# Patient Record
Sex: Male | Born: 1955 | Race: White | Hispanic: No | Marital: Single
Health system: Southern US, Community
[De-identification: ages and names within clinical notes are randomized; demographics above are authoritative.]

## PROBLEM LIST (undated history)

## (undated) HISTORY — PX: HIP SURGERY: SHX245

## (undated) HISTORY — PX: GALLBLADDER SURGERY: SHX652

---

## 2016-09-10 DIAGNOSIS — R0602 Shortness of breath: Secondary | ICD-10-CM | POA: Diagnosis not present

## 2016-09-10 DIAGNOSIS — K4041 Unilateral inguinal hernia, with gangrene, recurrent: Secondary | ICD-10-CM | POA: Diagnosis not present

## 2016-09-10 DIAGNOSIS — K4090 Unilateral inguinal hernia, without obstruction or gangrene, not specified as recurrent: Secondary | ICD-10-CM | POA: Diagnosis not present

## 2016-09-10 DIAGNOSIS — J439 Emphysema, unspecified: Secondary | ICD-10-CM | POA: Diagnosis not present

## 2016-09-10 DIAGNOSIS — R109 Unspecified abdominal pain: Secondary | ICD-10-CM | POA: Diagnosis not present

## 2016-09-10 DIAGNOSIS — J189 Pneumonia, unspecified organism: Secondary | ICD-10-CM | POA: Diagnosis not present

## 2016-09-10 DIAGNOSIS — K4091 Unilateral inguinal hernia, without obstruction or gangrene, recurrent: Secondary | ICD-10-CM | POA: Diagnosis not present

## 2016-09-10 DIAGNOSIS — N50811 Right testicular pain: Secondary | ICD-10-CM | POA: Diagnosis not present

## 2016-09-10 DIAGNOSIS — R918 Other nonspecific abnormal finding of lung field: Secondary | ICD-10-CM | POA: Diagnosis not present

## 2016-09-13 DIAGNOSIS — J189 Pneumonia, unspecified organism: Secondary | ICD-10-CM | POA: Diagnosis not present

## 2016-09-13 DIAGNOSIS — J181 Lobar pneumonia, unspecified organism: Secondary | ICD-10-CM | POA: Diagnosis not present

## 2016-09-13 DIAGNOSIS — R161 Splenomegaly, not elsewhere classified: Secondary | ICD-10-CM | POA: Diagnosis not present

## 2016-09-13 DIAGNOSIS — K4090 Unilateral inguinal hernia, without obstruction or gangrene, not specified as recurrent: Secondary | ICD-10-CM | POA: Diagnosis not present

## 2016-09-13 DIAGNOSIS — R0602 Shortness of breath: Secondary | ICD-10-CM | POA: Diagnosis not present

## 2016-09-13 DIAGNOSIS — R918 Other nonspecific abnormal finding of lung field: Secondary | ICD-10-CM | POA: Diagnosis not present

## 2016-09-13 DIAGNOSIS — R109 Unspecified abdominal pain: Secondary | ICD-10-CM | POA: Diagnosis not present

## 2016-09-13 DIAGNOSIS — N50819 Testicular pain, unspecified: Secondary | ICD-10-CM | POA: Diagnosis not present

## 2016-09-13 DIAGNOSIS — M539 Dorsopathy, unspecified: Secondary | ICD-10-CM | POA: Diagnosis not present

## 2016-09-13 DIAGNOSIS — N5089 Other specified disorders of the male genital organs: Secondary | ICD-10-CM | POA: Diagnosis not present

## 2016-11-08 DIAGNOSIS — Z1329 Encounter for screening for other suspected endocrine disorder: Secondary | ICD-10-CM | POA: Diagnosis not present

## 2016-11-08 DIAGNOSIS — F99 Mental disorder, not otherwise specified: Secondary | ICD-10-CM | POA: Diagnosis not present

## 2016-11-08 DIAGNOSIS — Z13220 Encounter for screening for lipoid disorders: Secondary | ICD-10-CM | POA: Diagnosis not present

## 2016-11-08 DIAGNOSIS — K4090 Unilateral inguinal hernia, without obstruction or gangrene, not specified as recurrent: Secondary | ICD-10-CM | POA: Diagnosis not present

## 2016-11-08 DIAGNOSIS — F338 Other recurrent depressive disorders: Secondary | ICD-10-CM | POA: Diagnosis not present

## 2016-11-08 DIAGNOSIS — R5383 Other fatigue: Secondary | ICD-10-CM | POA: Diagnosis not present

## 2016-11-08 DIAGNOSIS — Z79899 Other long term (current) drug therapy: Secondary | ICD-10-CM | POA: Diagnosis not present

## 2016-12-16 DIAGNOSIS — Z23 Encounter for immunization: Secondary | ICD-10-CM | POA: Diagnosis not present

## 2016-12-19 DIAGNOSIS — K4090 Unilateral inguinal hernia, without obstruction or gangrene, not specified as recurrent: Secondary | ICD-10-CM | POA: Diagnosis not present

## 2016-12-27 DIAGNOSIS — R338 Other retention of urine: Secondary | ICD-10-CM | POA: Diagnosis not present

## 2016-12-27 DIAGNOSIS — Z79899 Other long term (current) drug therapy: Secondary | ICD-10-CM | POA: Diagnosis not present

## 2016-12-27 DIAGNOSIS — F329 Major depressive disorder, single episode, unspecified: Secondary | ICD-10-CM | POA: Diagnosis not present

## 2016-12-27 DIAGNOSIS — K4030 Unilateral inguinal hernia, with obstruction, without gangrene, not specified as recurrent: Secondary | ICD-10-CM | POA: Diagnosis not present

## 2016-12-27 DIAGNOSIS — Y838 Other surgical procedures as the cause of abnormal reaction of the patient, or of later complication, without mention of misadventure at the time of the procedure: Secondary | ICD-10-CM | POA: Diagnosis not present

## 2016-12-27 DIAGNOSIS — K4090 Unilateral inguinal hernia, without obstruction or gangrene, not specified as recurrent: Secondary | ICD-10-CM | POA: Diagnosis not present

## 2016-12-27 DIAGNOSIS — F17210 Nicotine dependence, cigarettes, uncomplicated: Secondary | ICD-10-CM | POA: Diagnosis not present

## 2017-01-03 DIAGNOSIS — R338 Other retention of urine: Secondary | ICD-10-CM | POA: Diagnosis not present

## 2017-01-03 DIAGNOSIS — N9989 Other postprocedural complications and disorders of genitourinary system: Secondary | ICD-10-CM | POA: Diagnosis not present

## 2017-01-17 DIAGNOSIS — Z23 Encounter for immunization: Secondary | ICD-10-CM | POA: Diagnosis not present

## 2017-02-10 DIAGNOSIS — Z125 Encounter for screening for malignant neoplasm of prostate: Secondary | ICD-10-CM | POA: Diagnosis not present

## 2017-02-10 DIAGNOSIS — F338 Other recurrent depressive disorders: Secondary | ICD-10-CM | POA: Diagnosis not present

## 2017-02-10 DIAGNOSIS — Z0000 Encounter for general adult medical examination without abnormal findings: Secondary | ICD-10-CM | POA: Diagnosis not present

## 2017-02-10 DIAGNOSIS — Z131 Encounter for screening for diabetes mellitus: Secondary | ICD-10-CM | POA: Diagnosis not present

## 2017-02-10 DIAGNOSIS — M25551 Pain in right hip: Secondary | ICD-10-CM | POA: Diagnosis not present

## 2017-02-10 DIAGNOSIS — F99 Mental disorder, not otherwise specified: Secondary | ICD-10-CM | POA: Diagnosis not present

## 2017-04-03 DIAGNOSIS — Z79899 Other long term (current) drug therapy: Secondary | ICD-10-CM | POA: Diagnosis not present

## 2017-04-03 DIAGNOSIS — F17210 Nicotine dependence, cigarettes, uncomplicated: Secondary | ICD-10-CM | POA: Diagnosis not present

## 2017-04-03 DIAGNOSIS — F329 Major depressive disorder, single episode, unspecified: Secondary | ICD-10-CM | POA: Diagnosis not present

## 2017-04-03 DIAGNOSIS — Z1211 Encounter for screening for malignant neoplasm of colon: Secondary | ICD-10-CM | POA: Diagnosis not present

## 2017-05-30 DIAGNOSIS — F338 Other recurrent depressive disorders: Secondary | ICD-10-CM | POA: Diagnosis not present

## 2017-05-30 DIAGNOSIS — F99 Mental disorder, not otherwise specified: Secondary | ICD-10-CM | POA: Diagnosis not present

## 2017-05-30 DIAGNOSIS — M25551 Pain in right hip: Secondary | ICD-10-CM | POA: Diagnosis not present

## 2017-06-02 DIAGNOSIS — M25551 Pain in right hip: Secondary | ICD-10-CM | POA: Diagnosis not present

## 2017-06-09 DIAGNOSIS — Z23 Encounter for immunization: Secondary | ICD-10-CM | POA: Diagnosis not present

## 2017-06-29 DIAGNOSIS — M810 Age-related osteoporosis without current pathological fracture: Secondary | ICD-10-CM | POA: Diagnosis not present

## 2017-06-29 DIAGNOSIS — M818 Other osteoporosis without current pathological fracture: Secondary | ICD-10-CM | POA: Diagnosis not present

## 2017-08-29 DIAGNOSIS — M7502 Adhesive capsulitis of left shoulder: Secondary | ICD-10-CM | POA: Diagnosis not present

## 2017-11-29 DIAGNOSIS — M25551 Pain in right hip: Secondary | ICD-10-CM | POA: Diagnosis not present

## 2017-11-29 DIAGNOSIS — F329 Major depressive disorder, single episode, unspecified: Secondary | ICD-10-CM | POA: Diagnosis not present

## 2017-11-29 DIAGNOSIS — M818 Other osteoporosis without current pathological fracture: Secondary | ICD-10-CM | POA: Diagnosis not present

## 2017-11-29 DIAGNOSIS — M25512 Pain in left shoulder: Secondary | ICD-10-CM | POA: Diagnosis not present

## 2017-12-01 DIAGNOSIS — M25551 Pain in right hip: Secondary | ICD-10-CM | POA: Diagnosis not present

## 2018-03-06 DIAGNOSIS — M25551 Pain in right hip: Secondary | ICD-10-CM | POA: Diagnosis not present

## 2018-03-06 DIAGNOSIS — F329 Major depressive disorder, single episode, unspecified: Secondary | ICD-10-CM | POA: Diagnosis not present

## 2018-03-06 DIAGNOSIS — M818 Other osteoporosis without current pathological fracture: Secondary | ICD-10-CM | POA: Diagnosis not present

## 2018-07-10 DIAGNOSIS — M818 Other osteoporosis without current pathological fracture: Secondary | ICD-10-CM | POA: Diagnosis not present

## 2018-07-10 DIAGNOSIS — L723 Sebaceous cyst: Secondary | ICD-10-CM | POA: Diagnosis not present

## 2018-07-10 DIAGNOSIS — F3289 Other specified depressive episodes: Secondary | ICD-10-CM | POA: Diagnosis not present

## 2018-10-10 DIAGNOSIS — F329 Major depressive disorder, single episode, unspecified: Secondary | ICD-10-CM | POA: Diagnosis not present

## 2018-10-10 DIAGNOSIS — M818 Other osteoporosis without current pathological fracture: Secondary | ICD-10-CM | POA: Diagnosis not present

## 2019-01-09 DIAGNOSIS — M818 Other osteoporosis without current pathological fracture: Secondary | ICD-10-CM | POA: Diagnosis not present

## 2019-01-09 DIAGNOSIS — F329 Major depressive disorder, single episode, unspecified: Secondary | ICD-10-CM | POA: Diagnosis not present

## 2019-05-09 DIAGNOSIS — M818 Other osteoporosis without current pathological fracture: Secondary | ICD-10-CM | POA: Diagnosis not present

## 2019-05-09 DIAGNOSIS — F329 Major depressive disorder, single episode, unspecified: Secondary | ICD-10-CM | POA: Diagnosis not present

## 2019-09-09 DIAGNOSIS — F329 Major depressive disorder, single episode, unspecified: Secondary | ICD-10-CM | POA: Diagnosis not present

## 2019-09-09 DIAGNOSIS — M818 Other osteoporosis without current pathological fracture: Secondary | ICD-10-CM | POA: Diagnosis not present

## 2019-09-09 DIAGNOSIS — Z125 Encounter for screening for malignant neoplasm of prostate: Secondary | ICD-10-CM | POA: Diagnosis not present

## 2020-01-13 DIAGNOSIS — M818 Other osteoporosis without current pathological fracture: Secondary | ICD-10-CM | POA: Diagnosis not present

## 2020-01-13 DIAGNOSIS — M25531 Pain in right wrist: Secondary | ICD-10-CM | POA: Diagnosis not present

## 2020-01-13 DIAGNOSIS — F329 Major depressive disorder, single episode, unspecified: Secondary | ICD-10-CM | POA: Diagnosis not present

## 2020-04-15 DIAGNOSIS — F329 Major depressive disorder, single episode, unspecified: Secondary | ICD-10-CM | POA: Diagnosis not present

## 2020-04-15 DIAGNOSIS — M25511 Pain in right shoulder: Secondary | ICD-10-CM | POA: Diagnosis not present

## 2020-04-15 DIAGNOSIS — M818 Other osteoporosis without current pathological fracture: Secondary | ICD-10-CM | POA: Diagnosis not present

## 2020-07-22 DIAGNOSIS — M25511 Pain in right shoulder: Secondary | ICD-10-CM | POA: Diagnosis not present

## 2020-07-22 DIAGNOSIS — F329 Major depressive disorder, single episode, unspecified: Secondary | ICD-10-CM | POA: Diagnosis not present

## 2020-07-22 DIAGNOSIS — F339 Major depressive disorder, recurrent, unspecified: Secondary | ICD-10-CM | POA: Diagnosis not present

## 2020-07-22 DIAGNOSIS — M818 Other osteoporosis without current pathological fracture: Secondary | ICD-10-CM | POA: Diagnosis not present

## 2020-10-21 DIAGNOSIS — F329 Major depressive disorder, single episode, unspecified: Secondary | ICD-10-CM | POA: Diagnosis not present

## 2020-10-21 DIAGNOSIS — Z125 Encounter for screening for malignant neoplasm of prostate: Secondary | ICD-10-CM | POA: Diagnosis not present

## 2020-10-21 DIAGNOSIS — M818 Other osteoporosis without current pathological fracture: Secondary | ICD-10-CM | POA: Diagnosis not present

## 2020-10-21 DIAGNOSIS — F339 Major depressive disorder, recurrent, unspecified: Secondary | ICD-10-CM | POA: Diagnosis not present

## 2020-10-21 DIAGNOSIS — M25511 Pain in right shoulder: Secondary | ICD-10-CM | POA: Diagnosis not present

## 2020-10-21 DIAGNOSIS — E7849 Other hyperlipidemia: Secondary | ICD-10-CM | POA: Diagnosis not present

## 2021-01-25 DIAGNOSIS — F329 Major depressive disorder, single episode, unspecified: Secondary | ICD-10-CM | POA: Diagnosis not present

## 2021-01-25 DIAGNOSIS — F339 Major depressive disorder, recurrent, unspecified: Secondary | ICD-10-CM | POA: Diagnosis not present

## 2021-01-25 DIAGNOSIS — M818 Other osteoporosis without current pathological fracture: Secondary | ICD-10-CM | POA: Diagnosis not present

## 2021-01-25 DIAGNOSIS — M25511 Pain in right shoulder: Secondary | ICD-10-CM | POA: Diagnosis not present

## 2021-04-28 DIAGNOSIS — Z125 Encounter for screening for malignant neoplasm of prostate: Secondary | ICD-10-CM | POA: Diagnosis not present

## 2021-04-28 DIAGNOSIS — M25511 Pain in right shoulder: Secondary | ICD-10-CM | POA: Diagnosis not present

## 2021-04-28 DIAGNOSIS — F339 Major depressive disorder, recurrent, unspecified: Secondary | ICD-10-CM | POA: Diagnosis not present

## 2021-04-28 DIAGNOSIS — M818 Other osteoporosis without current pathological fracture: Secondary | ICD-10-CM | POA: Diagnosis not present

## 2021-04-28 DIAGNOSIS — I10 Essential (primary) hypertension: Secondary | ICD-10-CM | POA: Diagnosis not present

## 2021-04-28 DIAGNOSIS — E782 Mixed hyperlipidemia: Secondary | ICD-10-CM | POA: Diagnosis not present

## 2021-07-29 DIAGNOSIS — M818 Other osteoporosis without current pathological fracture: Secondary | ICD-10-CM | POA: Diagnosis not present

## 2021-07-29 DIAGNOSIS — F339 Major depressive disorder, recurrent, unspecified: Secondary | ICD-10-CM | POA: Diagnosis not present

## 2021-07-29 DIAGNOSIS — M25511 Pain in right shoulder: Secondary | ICD-10-CM | POA: Diagnosis not present

## 2021-08-29 DIAGNOSIS — I6529 Occlusion and stenosis of unspecified carotid artery: Secondary | ICD-10-CM | POA: Diagnosis not present

## 2021-08-29 DIAGNOSIS — M25551 Pain in right hip: Secondary | ICD-10-CM | POA: Diagnosis not present

## 2021-08-29 DIAGNOSIS — S72141A Displaced intertrochanteric fracture of right femur, initial encounter for closed fracture: Secondary | ICD-10-CM | POA: Diagnosis not present

## 2021-08-29 DIAGNOSIS — S728X1A Other fracture of right femur, initial encounter for closed fracture: Secondary | ICD-10-CM | POA: Diagnosis not present

## 2021-08-29 DIAGNOSIS — S0181XA Laceration without foreign body of other part of head, initial encounter: Secondary | ICD-10-CM | POA: Diagnosis not present

## 2021-08-29 DIAGNOSIS — S72001A Fracture of unspecified part of neck of right femur, initial encounter for closed fracture: Secondary | ICD-10-CM | POA: Diagnosis not present

## 2021-08-29 DIAGNOSIS — W1839XA Other fall on same level, initial encounter: Secondary | ICD-10-CM | POA: Diagnosis not present

## 2021-08-29 DIAGNOSIS — S299XXA Unspecified injury of thorax, initial encounter: Secondary | ICD-10-CM | POA: Diagnosis not present

## 2021-08-29 DIAGNOSIS — J40 Bronchitis, not specified as acute or chronic: Secondary | ICD-10-CM | POA: Diagnosis not present

## 2021-08-29 DIAGNOSIS — R918 Other nonspecific abnormal finding of lung field: Secondary | ICD-10-CM | POA: Diagnosis not present

## 2021-08-30 DIAGNOSIS — R4189 Other symptoms and signs involving cognitive functions and awareness: Secondary | ICD-10-CM | POA: Diagnosis not present

## 2021-08-30 DIAGNOSIS — S72141A Displaced intertrochanteric fracture of right femur, initial encounter for closed fracture: Secondary | ICD-10-CM | POA: Diagnosis present

## 2021-08-30 DIAGNOSIS — F88 Other disorders of psychological development: Secondary | ICD-10-CM | POA: Diagnosis present

## 2021-08-30 DIAGNOSIS — M6281 Muscle weakness (generalized): Secondary | ICD-10-CM | POA: Diagnosis not present

## 2021-08-30 DIAGNOSIS — Z87891 Personal history of nicotine dependence: Secondary | ICD-10-CM | POA: Diagnosis not present

## 2021-08-30 DIAGNOSIS — R6250 Unspecified lack of expected normal physiological development in childhood: Secondary | ICD-10-CM | POA: Diagnosis not present

## 2021-08-30 DIAGNOSIS — Y92009 Unspecified place in unspecified non-institutional (private) residence as the place of occurrence of the external cause: Secondary | ICD-10-CM | POA: Diagnosis not present

## 2021-08-30 DIAGNOSIS — R2689 Other abnormalities of gait and mobility: Secondary | ICD-10-CM | POA: Diagnosis not present

## 2021-08-30 DIAGNOSIS — R41841 Cognitive communication deficit: Secondary | ICD-10-CM | POA: Diagnosis not present

## 2021-08-30 DIAGNOSIS — Z20822 Contact with and (suspected) exposure to covid-19: Secondary | ICD-10-CM | POA: Diagnosis present

## 2021-08-30 DIAGNOSIS — Z7401 Bed confinement status: Secondary | ICD-10-CM | POA: Diagnosis not present

## 2021-08-30 DIAGNOSIS — F819 Developmental disorder of scholastic skills, unspecified: Secondary | ICD-10-CM | POA: Diagnosis not present

## 2021-08-30 DIAGNOSIS — E876 Hypokalemia: Secondary | ICD-10-CM | POA: Diagnosis present

## 2021-08-30 DIAGNOSIS — Z79899 Other long term (current) drug therapy: Secondary | ICD-10-CM | POA: Diagnosis not present

## 2021-08-30 DIAGNOSIS — W19XXXD Unspecified fall, subsequent encounter: Secondary | ICD-10-CM | POA: Diagnosis not present

## 2021-08-30 DIAGNOSIS — D72829 Elevated white blood cell count, unspecified: Secondary | ICD-10-CM | POA: Diagnosis not present

## 2021-08-30 DIAGNOSIS — J189 Pneumonia, unspecified organism: Secondary | ICD-10-CM | POA: Diagnosis not present

## 2021-08-30 DIAGNOSIS — R918 Other nonspecific abnormal finding of lung field: Secondary | ICD-10-CM | POA: Diagnosis not present

## 2021-08-30 DIAGNOSIS — F32A Depression, unspecified: Secondary | ICD-10-CM | POA: Diagnosis present

## 2021-08-30 DIAGNOSIS — R531 Weakness: Secondary | ICD-10-CM | POA: Diagnosis not present

## 2021-08-30 DIAGNOSIS — S72001A Fracture of unspecified part of neck of right femur, initial encounter for closed fracture: Secondary | ICD-10-CM | POA: Diagnosis not present

## 2021-08-30 DIAGNOSIS — F419 Anxiety disorder, unspecified: Secondary | ICD-10-CM | POA: Diagnosis present

## 2021-08-30 DIAGNOSIS — S72144A Nondisplaced intertrochanteric fracture of right femur, initial encounter for closed fracture: Secondary | ICD-10-CM | POA: Diagnosis not present

## 2021-08-30 DIAGNOSIS — R0902 Hypoxemia: Secondary | ICD-10-CM | POA: Diagnosis not present

## 2021-08-30 DIAGNOSIS — Z9181 History of falling: Secondary | ICD-10-CM | POA: Diagnosis not present

## 2021-08-30 DIAGNOSIS — S0181XA Laceration without foreign body of other part of head, initial encounter: Secondary | ICD-10-CM | POA: Diagnosis present

## 2021-08-30 DIAGNOSIS — R6520 Severe sepsis without septic shock: Secondary | ICD-10-CM | POA: Diagnosis not present

## 2021-08-30 DIAGNOSIS — F418 Other specified anxiety disorders: Secondary | ICD-10-CM | POA: Diagnosis not present

## 2021-08-30 DIAGNOSIS — T8149XD Infection following a procedure, other surgical site, subsequent encounter: Secondary | ICD-10-CM | POA: Diagnosis not present

## 2021-08-30 DIAGNOSIS — D62 Acute posthemorrhagic anemia: Secondary | ICD-10-CM | POA: Diagnosis present

## 2021-08-30 DIAGNOSIS — S72141D Displaced intertrochanteric fracture of right femur, subsequent encounter for closed fracture with routine healing: Secondary | ICD-10-CM | POA: Diagnosis not present

## 2021-08-30 DIAGNOSIS — W19XXXA Unspecified fall, initial encounter: Secondary | ICD-10-CM | POA: Diagnosis not present

## 2021-09-01 DIAGNOSIS — F419 Anxiety disorder, unspecified: Secondary | ICD-10-CM | POA: Diagnosis present

## 2021-09-01 DIAGNOSIS — M00859 Arthritis due to other bacteria, unspecified hip: Secondary | ICD-10-CM | POA: Diagnosis not present

## 2021-09-01 DIAGNOSIS — F819 Developmental disorder of scholastic skills, unspecified: Secondary | ICD-10-CM | POA: Diagnosis not present

## 2021-09-01 DIAGNOSIS — S72041A Displaced fracture of base of neck of right femur, initial encounter for closed fracture: Secondary | ICD-10-CM | POA: Diagnosis not present

## 2021-09-01 DIAGNOSIS — D72829 Elevated white blood cell count, unspecified: Secondary | ICD-10-CM | POA: Diagnosis not present

## 2021-09-01 DIAGNOSIS — Z7401 Bed confinement status: Secondary | ICD-10-CM | POA: Diagnosis not present

## 2021-09-01 DIAGNOSIS — F330 Major depressive disorder, recurrent, mild: Secondary | ICD-10-CM | POA: Diagnosis not present

## 2021-09-01 DIAGNOSIS — T8149XD Infection following a procedure, other surgical site, subsequent encounter: Secondary | ICD-10-CM | POA: Diagnosis not present

## 2021-09-01 DIAGNOSIS — F32A Depression, unspecified: Secondary | ICD-10-CM | POA: Diagnosis present

## 2021-09-01 DIAGNOSIS — Z20822 Contact with and (suspected) exposure to covid-19: Secondary | ICD-10-CM | POA: Diagnosis not present

## 2021-09-01 DIAGNOSIS — S72141D Displaced intertrochanteric fracture of right femur, subsequent encounter for closed fracture with routine healing: Secondary | ICD-10-CM | POA: Diagnosis not present

## 2021-09-01 DIAGNOSIS — Y92009 Unspecified place in unspecified non-institutional (private) residence as the place of occurrence of the external cause: Secondary | ICD-10-CM | POA: Diagnosis not present

## 2021-09-01 DIAGNOSIS — R4189 Other symptoms and signs involving cognitive functions and awareness: Secondary | ICD-10-CM | POA: Diagnosis not present

## 2021-09-01 DIAGNOSIS — K219 Gastro-esophageal reflux disease without esophagitis: Secondary | ICD-10-CM | POA: Diagnosis not present

## 2021-09-01 DIAGNOSIS — Z7901 Long term (current) use of anticoagulants: Secondary | ICD-10-CM | POA: Diagnosis not present

## 2021-09-01 DIAGNOSIS — M25551 Pain in right hip: Secondary | ICD-10-CM | POA: Diagnosis not present

## 2021-09-01 DIAGNOSIS — K5900 Constipation, unspecified: Secondary | ICD-10-CM | POA: Diagnosis not present

## 2021-09-01 DIAGNOSIS — Z9181 History of falling: Secondary | ICD-10-CM | POA: Diagnosis not present

## 2021-09-01 DIAGNOSIS — R918 Other nonspecific abnormal finding of lung field: Secondary | ICD-10-CM | POA: Diagnosis not present

## 2021-09-01 DIAGNOSIS — I700 Atherosclerosis of aorta: Secondary | ICD-10-CM | POA: Diagnosis not present

## 2021-09-01 DIAGNOSIS — T8141XA Infection following a procedure, superficial incisional surgical site, initial encounter: Secondary | ICD-10-CM | POA: Diagnosis not present

## 2021-09-01 DIAGNOSIS — R531 Weakness: Secondary | ICD-10-CM | POA: Diagnosis not present

## 2021-09-01 DIAGNOSIS — R2689 Other abnormalities of gait and mobility: Secondary | ICD-10-CM | POA: Diagnosis not present

## 2021-09-01 DIAGNOSIS — R41841 Cognitive communication deficit: Secondary | ICD-10-CM | POA: Diagnosis not present

## 2021-09-01 DIAGNOSIS — Z87891 Personal history of nicotine dependence: Secondary | ICD-10-CM | POA: Diagnosis not present

## 2021-09-01 DIAGNOSIS — W19XXXD Unspecified fall, subsequent encounter: Secondary | ICD-10-CM | POA: Diagnosis not present

## 2021-09-01 DIAGNOSIS — B9561 Methicillin susceptible Staphylococcus aureus infection as the cause of diseases classified elsewhere: Secondary | ICD-10-CM | POA: Diagnosis present

## 2021-09-01 DIAGNOSIS — F70 Mild intellectual disabilities: Secondary | ICD-10-CM | POA: Diagnosis not present

## 2021-09-01 DIAGNOSIS — M6281 Muscle weakness (generalized): Secondary | ICD-10-CM | POA: Diagnosis not present

## 2021-09-01 DIAGNOSIS — R0902 Hypoxemia: Secondary | ICD-10-CM | POA: Diagnosis not present

## 2021-09-01 DIAGNOSIS — T8149XA Infection following a procedure, other surgical site, initial encounter: Secondary | ICD-10-CM | POA: Diagnosis present

## 2021-09-02 DIAGNOSIS — K5900 Constipation, unspecified: Secondary | ICD-10-CM | POA: Diagnosis not present

## 2021-09-02 DIAGNOSIS — K219 Gastro-esophageal reflux disease without esophagitis: Secondary | ICD-10-CM | POA: Diagnosis not present

## 2021-09-02 DIAGNOSIS — F330 Major depressive disorder, recurrent, mild: Secondary | ICD-10-CM | POA: Diagnosis not present

## 2021-09-02 DIAGNOSIS — F70 Mild intellectual disabilities: Secondary | ICD-10-CM | POA: Diagnosis not present

## 2021-09-02 DIAGNOSIS — S72041A Displaced fracture of base of neck of right femur, initial encounter for closed fracture: Secondary | ICD-10-CM | POA: Diagnosis not present

## 2021-09-13 DIAGNOSIS — M25551 Pain in right hip: Secondary | ICD-10-CM | POA: Diagnosis not present

## 2021-09-22 DIAGNOSIS — I700 Atherosclerosis of aorta: Secondary | ICD-10-CM | POA: Diagnosis not present

## 2021-09-22 DIAGNOSIS — T8141XA Infection following a procedure, superficial incisional surgical site, initial encounter: Secondary | ICD-10-CM | POA: Diagnosis not present

## 2021-09-22 DIAGNOSIS — T8149XA Infection following a procedure, other surgical site, initial encounter: Secondary | ICD-10-CM | POA: Diagnosis not present

## 2021-09-22 DIAGNOSIS — Z7901 Long term (current) use of anticoagulants: Secondary | ICD-10-CM | POA: Diagnosis not present

## 2021-09-22 DIAGNOSIS — Z87891 Personal history of nicotine dependence: Secondary | ICD-10-CM | POA: Diagnosis not present

## 2021-09-30 DIAGNOSIS — M25551 Pain in right hip: Secondary | ICD-10-CM | POA: Diagnosis not present

## 2021-10-07 DIAGNOSIS — B9561 Methicillin susceptible Staphylococcus aureus infection as the cause of diseases classified elsewhere: Secondary | ICD-10-CM | POA: Diagnosis present

## 2021-10-07 DIAGNOSIS — Z9181 History of falling: Secondary | ICD-10-CM | POA: Diagnosis not present

## 2021-10-07 DIAGNOSIS — M00859 Arthritis due to other bacteria, unspecified hip: Secondary | ICD-10-CM | POA: Diagnosis not present

## 2021-10-07 DIAGNOSIS — T8451XA Infection and inflammatory reaction due to internal right hip prosthesis, initial encounter: Secondary | ICD-10-CM | POA: Diagnosis not present

## 2021-10-07 DIAGNOSIS — F819 Developmental disorder of scholastic skills, unspecified: Secondary | ICD-10-CM | POA: Diagnosis not present

## 2021-10-07 DIAGNOSIS — T8149XD Infection following a procedure, other surgical site, subsequent encounter: Secondary | ICD-10-CM | POA: Diagnosis not present

## 2021-10-07 DIAGNOSIS — Z20822 Contact with and (suspected) exposure to covid-19: Secondary | ICD-10-CM | POA: Diagnosis not present

## 2021-10-07 DIAGNOSIS — M00051 Staphylococcal arthritis, right hip: Secondary | ICD-10-CM | POA: Diagnosis not present

## 2021-10-07 DIAGNOSIS — R41841 Cognitive communication deficit: Secondary | ICD-10-CM | POA: Diagnosis not present

## 2021-10-07 DIAGNOSIS — Z9889 Other specified postprocedural states: Secondary | ICD-10-CM | POA: Diagnosis not present

## 2021-10-07 DIAGNOSIS — M00851 Arthritis due to other bacteria, right hip: Secondary | ICD-10-CM | POA: Diagnosis not present

## 2021-10-07 DIAGNOSIS — B9562 Methicillin resistant Staphylococcus aureus infection as the cause of diseases classified elsewhere: Secondary | ICD-10-CM | POA: Diagnosis not present

## 2021-10-07 DIAGNOSIS — F32A Depression, unspecified: Secondary | ICD-10-CM | POA: Diagnosis present

## 2021-10-07 DIAGNOSIS — Z87891 Personal history of nicotine dependence: Secondary | ICD-10-CM | POA: Diagnosis not present

## 2021-10-07 DIAGNOSIS — S72141D Displaced intertrochanteric fracture of right femur, subsequent encounter for closed fracture with routine healing: Secondary | ICD-10-CM | POA: Diagnosis not present

## 2021-10-07 DIAGNOSIS — R2689 Other abnormalities of gait and mobility: Secondary | ICD-10-CM | POA: Diagnosis not present

## 2021-10-07 DIAGNOSIS — M6281 Muscle weakness (generalized): Secondary | ICD-10-CM | POA: Diagnosis not present

## 2021-10-07 DIAGNOSIS — F419 Anxiety disorder, unspecified: Secondary | ICD-10-CM | POA: Diagnosis present

## 2021-10-07 DIAGNOSIS — S72001D Fracture of unspecified part of neck of right femur, subsequent encounter for closed fracture with routine healing: Secondary | ICD-10-CM | POA: Diagnosis not present

## 2021-10-07 DIAGNOSIS — T8149XA Infection following a procedure, other surgical site, initial encounter: Secondary | ICD-10-CM | POA: Diagnosis present

## 2021-10-07 DIAGNOSIS — I700 Atherosclerosis of aorta: Secondary | ICD-10-CM | POA: Diagnosis not present

## 2021-10-08 DIAGNOSIS — M00851 Arthritis due to other bacteria, right hip: Secondary | ICD-10-CM | POA: Diagnosis not present

## 2021-10-12 DIAGNOSIS — D1721 Benign lipomatous neoplasm of skin and subcutaneous tissue of right arm: Secondary | ICD-10-CM | POA: Diagnosis not present

## 2021-10-12 DIAGNOSIS — L02212 Cutaneous abscess of back [any part, except buttock]: Secondary | ICD-10-CM | POA: Diagnosis not present

## 2021-10-12 DIAGNOSIS — Z7901 Long term (current) use of anticoagulants: Secondary | ICD-10-CM | POA: Diagnosis not present

## 2021-10-12 DIAGNOSIS — S72001A Fracture of unspecified part of neck of right femur, initial encounter for closed fracture: Secondary | ICD-10-CM | POA: Diagnosis not present

## 2021-10-12 DIAGNOSIS — B9562 Methicillin resistant Staphylococcus aureus infection as the cause of diseases classified elsewhere: Secondary | ICD-10-CM | POA: Diagnosis not present

## 2021-10-12 DIAGNOSIS — T8143XA Infection following a procedure, organ and space surgical site, initial encounter: Secondary | ICD-10-CM | POA: Diagnosis not present

## 2021-10-12 DIAGNOSIS — M00051 Staphylococcal arthritis, right hip: Secondary | ICD-10-CM | POA: Diagnosis not present

## 2021-10-12 DIAGNOSIS — F88 Other disorders of psychological development: Secondary | ICD-10-CM | POA: Diagnosis not present

## 2021-10-12 DIAGNOSIS — Z20822 Contact with and (suspected) exposure to covid-19: Secondary | ICD-10-CM | POA: Diagnosis not present

## 2021-10-12 DIAGNOSIS — M00851 Arthritis due to other bacteria, right hip: Secondary | ICD-10-CM | POA: Diagnosis not present

## 2021-10-12 DIAGNOSIS — F32A Depression, unspecified: Secondary | ICD-10-CM | POA: Diagnosis not present

## 2021-10-12 DIAGNOSIS — R41841 Cognitive communication deficit: Secondary | ICD-10-CM | POA: Diagnosis not present

## 2021-10-12 DIAGNOSIS — Z79899 Other long term (current) drug therapy: Secondary | ICD-10-CM | POA: Diagnosis not present

## 2021-10-12 DIAGNOSIS — F419 Anxiety disorder, unspecified: Secondary | ICD-10-CM | POA: Diagnosis not present

## 2021-10-12 DIAGNOSIS — L02415 Cutaneous abscess of right lower limb: Secondary | ICD-10-CM | POA: Diagnosis not present

## 2021-10-12 DIAGNOSIS — E876 Hypokalemia: Secondary | ICD-10-CM | POA: Diagnosis not present

## 2021-10-12 DIAGNOSIS — R531 Weakness: Secondary | ICD-10-CM | POA: Diagnosis not present

## 2021-10-12 DIAGNOSIS — Z792 Long term (current) use of antibiotics: Secondary | ICD-10-CM | POA: Diagnosis not present

## 2021-10-12 DIAGNOSIS — L03115 Cellulitis of right lower limb: Secondary | ICD-10-CM | POA: Diagnosis not present

## 2021-10-12 DIAGNOSIS — A499 Bacterial infection, unspecified: Secondary | ICD-10-CM | POA: Diagnosis not present

## 2021-10-12 DIAGNOSIS — F819 Developmental disorder of scholastic skills, unspecified: Secondary | ICD-10-CM | POA: Diagnosis not present

## 2021-10-12 DIAGNOSIS — D509 Iron deficiency anemia, unspecified: Secondary | ICD-10-CM | POA: Diagnosis not present

## 2021-10-12 DIAGNOSIS — S72141D Displaced intertrochanteric fracture of right femur, subsequent encounter for closed fracture with routine healing: Secondary | ICD-10-CM | POA: Diagnosis not present

## 2021-10-12 DIAGNOSIS — F99 Mental disorder, not otherwise specified: Secondary | ICD-10-CM | POA: Diagnosis not present

## 2021-10-12 DIAGNOSIS — M25551 Pain in right hip: Secondary | ICD-10-CM | POA: Diagnosis not present

## 2021-10-12 DIAGNOSIS — L02413 Cutaneous abscess of right upper limb: Secondary | ICD-10-CM | POA: Diagnosis not present

## 2021-10-12 DIAGNOSIS — T8149XD Infection following a procedure, other surgical site, subsequent encounter: Secondary | ICD-10-CM | POA: Diagnosis not present

## 2021-10-12 DIAGNOSIS — Z9181 History of falling: Secondary | ICD-10-CM | POA: Diagnosis not present

## 2021-10-12 DIAGNOSIS — Z9889 Other specified postprocedural states: Secondary | ICD-10-CM | POA: Diagnosis not present

## 2021-10-12 DIAGNOSIS — L03119 Cellulitis of unspecified part of limb: Secondary | ICD-10-CM | POA: Diagnosis not present

## 2021-10-12 DIAGNOSIS — M6281 Muscle weakness (generalized): Secondary | ICD-10-CM | POA: Diagnosis not present

## 2021-10-12 DIAGNOSIS — A4902 Methicillin resistant Staphylococcus aureus infection, unspecified site: Secondary | ICD-10-CM | POA: Diagnosis not present

## 2021-10-12 DIAGNOSIS — R2689 Other abnormalities of gait and mobility: Secondary | ICD-10-CM | POA: Diagnosis not present

## 2021-10-12 DIAGNOSIS — S72001D Fracture of unspecified part of neck of right femur, subsequent encounter for closed fracture with routine healing: Secondary | ICD-10-CM | POA: Diagnosis not present

## 2021-10-12 DIAGNOSIS — S72141A Displaced intertrochanteric fracture of right femur, initial encounter for closed fracture: Secondary | ICD-10-CM | POA: Diagnosis not present

## 2021-10-13 DIAGNOSIS — R531 Weakness: Secondary | ICD-10-CM | POA: Diagnosis not present

## 2021-10-13 DIAGNOSIS — F99 Mental disorder, not otherwise specified: Secondary | ICD-10-CM | POA: Diagnosis not present

## 2021-10-13 DIAGNOSIS — A4902 Methicillin resistant Staphylococcus aureus infection, unspecified site: Secondary | ICD-10-CM | POA: Diagnosis not present

## 2021-10-28 DIAGNOSIS — B9562 Methicillin resistant Staphylococcus aureus infection as the cause of diseases classified elsewhere: Secondary | ICD-10-CM | POA: Diagnosis not present

## 2021-10-28 DIAGNOSIS — R41841 Cognitive communication deficit: Secondary | ICD-10-CM | POA: Diagnosis not present

## 2021-10-28 DIAGNOSIS — F819 Developmental disorder of scholastic skills, unspecified: Secondary | ICD-10-CM | POA: Diagnosis not present

## 2021-10-28 DIAGNOSIS — R2689 Other abnormalities of gait and mobility: Secondary | ICD-10-CM | POA: Diagnosis not present

## 2021-10-28 DIAGNOSIS — Z9889 Other specified postprocedural states: Secondary | ICD-10-CM | POA: Diagnosis not present

## 2021-10-28 DIAGNOSIS — L02413 Cutaneous abscess of right upper limb: Secondary | ICD-10-CM | POA: Diagnosis present

## 2021-10-28 DIAGNOSIS — F88 Other disorders of psychological development: Secondary | ICD-10-CM | POA: Diagnosis present

## 2021-10-28 DIAGNOSIS — M25551 Pain in right hip: Secondary | ICD-10-CM | POA: Diagnosis not present

## 2021-10-28 DIAGNOSIS — S72001A Fracture of unspecified part of neck of right femur, initial encounter for closed fracture: Secondary | ICD-10-CM | POA: Diagnosis not present

## 2021-10-28 DIAGNOSIS — L03119 Cellulitis of unspecified part of limb: Secondary | ICD-10-CM | POA: Diagnosis not present

## 2021-10-28 DIAGNOSIS — F32A Depression, unspecified: Secondary | ICD-10-CM | POA: Diagnosis present

## 2021-10-28 DIAGNOSIS — A499 Bacterial infection, unspecified: Secondary | ICD-10-CM | POA: Diagnosis present

## 2021-10-28 DIAGNOSIS — Z792 Long term (current) use of antibiotics: Secondary | ICD-10-CM | POA: Diagnosis not present

## 2021-10-28 DIAGNOSIS — T8143XA Infection following a procedure, organ and space surgical site, initial encounter: Secondary | ICD-10-CM | POA: Diagnosis present

## 2021-10-28 DIAGNOSIS — D509 Iron deficiency anemia, unspecified: Secondary | ICD-10-CM | POA: Diagnosis present

## 2021-10-28 DIAGNOSIS — M6281 Muscle weakness (generalized): Secondary | ICD-10-CM | POA: Diagnosis not present

## 2021-10-28 DIAGNOSIS — Z79899 Other long term (current) drug therapy: Secondary | ICD-10-CM | POA: Diagnosis not present

## 2021-10-28 DIAGNOSIS — E876 Hypokalemia: Secondary | ICD-10-CM | POA: Diagnosis present

## 2021-10-28 DIAGNOSIS — L02212 Cutaneous abscess of back [any part, except buttock]: Secondary | ICD-10-CM | POA: Diagnosis present

## 2021-10-28 DIAGNOSIS — L02415 Cutaneous abscess of right lower limb: Secondary | ICD-10-CM | POA: Diagnosis present

## 2021-10-28 DIAGNOSIS — S72141A Displaced intertrochanteric fracture of right femur, initial encounter for closed fracture: Secondary | ICD-10-CM | POA: Diagnosis not present

## 2021-10-28 DIAGNOSIS — Z7901 Long term (current) use of anticoagulants: Secondary | ICD-10-CM | POA: Diagnosis not present

## 2021-10-28 DIAGNOSIS — Z20822 Contact with and (suspected) exposure to covid-19: Secondary | ICD-10-CM | POA: Diagnosis present

## 2021-10-28 DIAGNOSIS — F419 Anxiety disorder, unspecified: Secondary | ICD-10-CM | POA: Diagnosis not present

## 2021-10-28 DIAGNOSIS — D1721 Benign lipomatous neoplasm of skin and subcutaneous tissue of right arm: Secondary | ICD-10-CM | POA: Diagnosis present

## 2021-10-28 DIAGNOSIS — Z9181 History of falling: Secondary | ICD-10-CM | POA: Diagnosis not present

## 2021-10-28 DIAGNOSIS — M00851 Arthritis due to other bacteria, right hip: Secondary | ICD-10-CM | POA: Diagnosis not present

## 2021-10-28 DIAGNOSIS — T8149XD Infection following a procedure, other surgical site, subsequent encounter: Secondary | ICD-10-CM | POA: Diagnosis not present

## 2021-10-28 DIAGNOSIS — S72141D Displaced intertrochanteric fracture of right femur, subsequent encounter for closed fracture with routine healing: Secondary | ICD-10-CM | POA: Diagnosis not present

## 2021-10-28 DIAGNOSIS — L03115 Cellulitis of right lower limb: Secondary | ICD-10-CM | POA: Diagnosis present

## 2021-11-10 DIAGNOSIS — I700 Atherosclerosis of aorta: Secondary | ICD-10-CM | POA: Diagnosis not present

## 2021-11-10 DIAGNOSIS — E876 Hypokalemia: Secondary | ICD-10-CM | POA: Diagnosis not present

## 2021-11-10 DIAGNOSIS — Z7901 Long term (current) use of anticoagulants: Secondary | ICD-10-CM | POA: Diagnosis not present

## 2021-11-10 DIAGNOSIS — L02413 Cutaneous abscess of right upper limb: Secondary | ICD-10-CM | POA: Diagnosis not present

## 2021-11-10 DIAGNOSIS — S72141D Displaced intertrochanteric fracture of right femur, subsequent encounter for closed fracture with routine healing: Secondary | ICD-10-CM | POA: Diagnosis not present

## 2021-11-10 DIAGNOSIS — F99 Mental disorder, not otherwise specified: Secondary | ICD-10-CM | POA: Diagnosis not present

## 2021-11-10 DIAGNOSIS — D1721 Benign lipomatous neoplasm of skin and subcutaneous tissue of right arm: Secondary | ICD-10-CM | POA: Diagnosis not present

## 2021-11-10 DIAGNOSIS — F88 Other disorders of psychological development: Secondary | ICD-10-CM | POA: Diagnosis not present

## 2021-11-10 DIAGNOSIS — M25551 Pain in right hip: Secondary | ICD-10-CM | POA: Diagnosis not present

## 2021-11-10 DIAGNOSIS — U071 COVID-19: Secondary | ICD-10-CM | POA: Diagnosis not present

## 2021-11-10 DIAGNOSIS — R41841 Cognitive communication deficit: Secondary | ICD-10-CM | POA: Diagnosis not present

## 2021-11-10 DIAGNOSIS — S71101A Unspecified open wound, right thigh, initial encounter: Secondary | ICD-10-CM | POA: Diagnosis not present

## 2021-11-10 DIAGNOSIS — Z48817 Encounter for surgical aftercare following surgery on the skin and subcutaneous tissue: Secondary | ICD-10-CM | POA: Diagnosis not present

## 2021-11-10 DIAGNOSIS — S72001A Fracture of unspecified part of neck of right femur, initial encounter for closed fracture: Secondary | ICD-10-CM | POA: Diagnosis not present

## 2021-11-10 DIAGNOSIS — R2689 Other abnormalities of gait and mobility: Secondary | ICD-10-CM | POA: Diagnosis not present

## 2021-11-10 DIAGNOSIS — T84620D Infection and inflammatory reaction due to internal fixation device of right femur, subsequent encounter: Secondary | ICD-10-CM | POA: Diagnosis not present

## 2021-11-10 DIAGNOSIS — D1779 Benign lipomatous neoplasm of other sites: Secondary | ICD-10-CM | POA: Diagnosis not present

## 2021-11-10 DIAGNOSIS — F819 Developmental disorder of scholastic skills, unspecified: Secondary | ICD-10-CM | POA: Diagnosis not present

## 2021-11-10 DIAGNOSIS — L03115 Cellulitis of right lower limb: Secondary | ICD-10-CM | POA: Diagnosis not present

## 2021-11-10 DIAGNOSIS — Z9889 Other specified postprocedural states: Secondary | ICD-10-CM | POA: Diagnosis not present

## 2021-11-10 DIAGNOSIS — S72141A Displaced intertrochanteric fracture of right femur, initial encounter for closed fracture: Secondary | ICD-10-CM | POA: Diagnosis not present

## 2021-11-10 DIAGNOSIS — T8143XA Infection following a procedure, organ and space surgical site, initial encounter: Secondary | ICD-10-CM | POA: Diagnosis not present

## 2021-11-10 DIAGNOSIS — L02212 Cutaneous abscess of back [any part, except buttock]: Secondary | ICD-10-CM | POA: Diagnosis not present

## 2021-11-10 DIAGNOSIS — Z9181 History of falling: Secondary | ICD-10-CM | POA: Diagnosis not present

## 2021-11-10 DIAGNOSIS — M25511 Pain in right shoulder: Secondary | ICD-10-CM | POA: Diagnosis not present

## 2021-11-10 DIAGNOSIS — T8451XA Infection and inflammatory reaction due to internal right hip prosthesis, initial encounter: Secondary | ICD-10-CM | POA: Diagnosis not present

## 2021-11-10 DIAGNOSIS — Z8739 Personal history of other diseases of the musculoskeletal system and connective tissue: Secondary | ICD-10-CM | POA: Diagnosis not present

## 2021-11-10 DIAGNOSIS — F419 Anxiety disorder, unspecified: Secondary | ICD-10-CM | POA: Diagnosis not present

## 2021-11-10 DIAGNOSIS — Z79899 Other long term (current) drug therapy: Secondary | ICD-10-CM | POA: Diagnosis not present

## 2021-11-10 DIAGNOSIS — L02415 Cutaneous abscess of right lower limb: Secondary | ICD-10-CM | POA: Diagnosis not present

## 2021-11-10 DIAGNOSIS — Z452 Encounter for adjustment and management of vascular access device: Secondary | ICD-10-CM | POA: Diagnosis not present

## 2021-11-10 DIAGNOSIS — T8149XD Infection following a procedure, other surgical site, subsequent encounter: Secondary | ICD-10-CM | POA: Diagnosis not present

## 2021-11-10 DIAGNOSIS — K219 Gastro-esophageal reflux disease without esophagitis: Secondary | ICD-10-CM | POA: Diagnosis not present

## 2021-11-10 DIAGNOSIS — Z792 Long term (current) use of antibiotics: Secondary | ICD-10-CM | POA: Diagnosis not present

## 2021-11-10 DIAGNOSIS — A4902 Methicillin resistant Staphylococcus aureus infection, unspecified site: Secondary | ICD-10-CM | POA: Diagnosis not present

## 2021-11-10 DIAGNOSIS — L03119 Cellulitis of unspecified part of limb: Secondary | ICD-10-CM | POA: Diagnosis not present

## 2021-11-10 DIAGNOSIS — M6281 Muscle weakness (generalized): Secondary | ICD-10-CM | POA: Diagnosis not present

## 2021-11-10 DIAGNOSIS — M25451 Effusion, right hip: Secondary | ICD-10-CM | POA: Diagnosis not present

## 2021-11-10 DIAGNOSIS — F32A Depression, unspecified: Secondary | ICD-10-CM | POA: Diagnosis not present

## 2021-11-10 DIAGNOSIS — Z872 Personal history of diseases of the skin and subcutaneous tissue: Secondary | ICD-10-CM | POA: Diagnosis not present

## 2021-11-10 DIAGNOSIS — A499 Bacterial infection, unspecified: Secondary | ICD-10-CM | POA: Diagnosis not present

## 2021-11-10 DIAGNOSIS — D509 Iron deficiency anemia, unspecified: Secondary | ICD-10-CM | POA: Diagnosis not present

## 2021-11-10 DIAGNOSIS — M00851 Arthritis due to other bacteria, right hip: Secondary | ICD-10-CM | POA: Diagnosis not present

## 2021-11-10 DIAGNOSIS — S72101A Unspecified trochanteric fracture of right femur, initial encounter for closed fracture: Secondary | ICD-10-CM | POA: Diagnosis not present

## 2021-11-10 DIAGNOSIS — Z20822 Contact with and (suspected) exposure to covid-19: Secondary | ICD-10-CM | POA: Diagnosis not present

## 2021-11-10 DIAGNOSIS — M009 Pyogenic arthritis, unspecified: Secondary | ICD-10-CM | POA: Diagnosis not present

## 2021-11-10 DIAGNOSIS — D649 Anemia, unspecified: Secondary | ICD-10-CM | POA: Diagnosis not present

## 2021-11-10 DIAGNOSIS — F330 Major depressive disorder, recurrent, mild: Secondary | ICD-10-CM | POA: Diagnosis not present

## 2021-11-17 DIAGNOSIS — F32A Depression, unspecified: Secondary | ICD-10-CM | POA: Diagnosis not present

## 2021-11-17 DIAGNOSIS — S72141D Displaced intertrochanteric fracture of right femur, subsequent encounter for closed fracture with routine healing: Secondary | ICD-10-CM | POA: Diagnosis not present

## 2021-11-17 DIAGNOSIS — M009 Pyogenic arthritis, unspecified: Secondary | ICD-10-CM | POA: Diagnosis not present

## 2021-11-17 DIAGNOSIS — L02212 Cutaneous abscess of back [any part, except buttock]: Secondary | ICD-10-CM | POA: Diagnosis not present

## 2021-11-17 DIAGNOSIS — F819 Developmental disorder of scholastic skills, unspecified: Secondary | ICD-10-CM | POA: Diagnosis not present

## 2021-11-17 DIAGNOSIS — K219 Gastro-esophageal reflux disease without esophagitis: Secondary | ICD-10-CM | POA: Diagnosis not present

## 2021-11-17 DIAGNOSIS — Z48817 Encounter for surgical aftercare following surgery on the skin and subcutaneous tissue: Secondary | ICD-10-CM | POA: Diagnosis not present

## 2021-11-17 DIAGNOSIS — U071 COVID-19: Secondary | ICD-10-CM | POA: Diagnosis not present

## 2021-11-17 DIAGNOSIS — M25551 Pain in right hip: Secondary | ICD-10-CM | POA: Diagnosis not present

## 2021-11-17 DIAGNOSIS — A4902 Methicillin resistant Staphylococcus aureus infection, unspecified site: Secondary | ICD-10-CM | POA: Diagnosis not present

## 2021-11-17 DIAGNOSIS — Z9181 History of falling: Secondary | ICD-10-CM | POA: Diagnosis not present

## 2021-11-17 DIAGNOSIS — L02413 Cutaneous abscess of right upper limb: Secondary | ICD-10-CM | POA: Diagnosis not present

## 2021-11-17 DIAGNOSIS — F330 Major depressive disorder, recurrent, mild: Secondary | ICD-10-CM | POA: Diagnosis not present

## 2021-11-17 DIAGNOSIS — M6281 Muscle weakness (generalized): Secondary | ICD-10-CM | POA: Diagnosis not present

## 2021-11-17 DIAGNOSIS — R2689 Other abnormalities of gait and mobility: Secondary | ICD-10-CM | POA: Diagnosis not present

## 2021-11-17 DIAGNOSIS — F99 Mental disorder, not otherwise specified: Secondary | ICD-10-CM | POA: Diagnosis not present

## 2021-11-17 DIAGNOSIS — T8149XD Infection following a procedure, other surgical site, subsequent encounter: Secondary | ICD-10-CM | POA: Diagnosis not present

## 2021-11-17 DIAGNOSIS — F419 Anxiety disorder, unspecified: Secondary | ICD-10-CM | POA: Diagnosis not present

## 2021-11-17 DIAGNOSIS — Z09 Encounter for follow-up examination after completed treatment for conditions other than malignant neoplasm: Secondary | ICD-10-CM | POA: Diagnosis not present

## 2021-11-17 DIAGNOSIS — R41841 Cognitive communication deficit: Secondary | ICD-10-CM | POA: Diagnosis not present

## 2021-11-17 DIAGNOSIS — D649 Anemia, unspecified: Secondary | ICD-10-CM | POA: Diagnosis not present

## 2021-11-17 DIAGNOSIS — T8141XA Infection following a procedure, superficial incisional surgical site, initial encounter: Secondary | ICD-10-CM | POA: Diagnosis not present

## 2021-12-02 DIAGNOSIS — T8141XA Infection following a procedure, superficial incisional surgical site, initial encounter: Secondary | ICD-10-CM | POA: Diagnosis not present

## 2021-12-02 DIAGNOSIS — Z09 Encounter for follow-up examination after completed treatment for conditions other than malignant neoplasm: Secondary | ICD-10-CM | POA: Diagnosis not present

## 2021-12-02 DIAGNOSIS — M25551 Pain in right hip: Secondary | ICD-10-CM | POA: Diagnosis not present

## 2021-12-16 DIAGNOSIS — L02413 Cutaneous abscess of right upper limb: Secondary | ICD-10-CM | POA: Diagnosis not present

## 2021-12-16 DIAGNOSIS — M25551 Pain in right hip: Secondary | ICD-10-CM | POA: Diagnosis not present

## 2021-12-22 DIAGNOSIS — Z96641 Presence of right artificial hip joint: Secondary | ICD-10-CM | POA: Diagnosis not present

## 2021-12-22 DIAGNOSIS — M25511 Pain in right shoulder: Secondary | ICD-10-CM | POA: Diagnosis not present

## 2021-12-22 DIAGNOSIS — M818 Other osteoporosis without current pathological fracture: Secondary | ICD-10-CM | POA: Diagnosis not present

## 2021-12-22 DIAGNOSIS — F339 Major depressive disorder, recurrent, unspecified: Secondary | ICD-10-CM | POA: Diagnosis not present

## 2022-01-16 DIAGNOSIS — S72001D Fracture of unspecified part of neck of right femur, subsequent encounter for closed fracture with routine healing: Secondary | ICD-10-CM | POA: Diagnosis not present

## 2022-01-16 DIAGNOSIS — E43 Unspecified severe protein-calorie malnutrition: Secondary | ICD-10-CM | POA: Diagnosis not present

## 2022-01-16 DIAGNOSIS — I700 Atherosclerosis of aorta: Secondary | ICD-10-CM | POA: Diagnosis not present

## 2022-01-16 DIAGNOSIS — K8020 Calculus of gallbladder without cholecystitis without obstruction: Secondary | ICD-10-CM | POA: Diagnosis not present

## 2022-01-16 DIAGNOSIS — R109 Unspecified abdominal pain: Secondary | ICD-10-CM | POA: Diagnosis not present

## 2022-01-16 DIAGNOSIS — Z8781 Personal history of (healed) traumatic fracture: Secondary | ICD-10-CM | POA: Diagnosis not present

## 2022-01-16 DIAGNOSIS — Z960 Presence of urogenital implants: Secondary | ICD-10-CM | POA: Diagnosis not present

## 2022-01-16 DIAGNOSIS — R1110 Vomiting, unspecified: Secondary | ICD-10-CM | POA: Diagnosis not present

## 2022-01-16 DIAGNOSIS — K859 Acute pancreatitis without necrosis or infection, unspecified: Secondary | ICD-10-CM | POA: Diagnosis not present

## 2022-01-16 DIAGNOSIS — K567 Ileus, unspecified: Secondary | ICD-10-CM | POA: Diagnosis not present

## 2022-01-16 DIAGNOSIS — K8050 Calculus of bile duct without cholangitis or cholecystitis without obstruction: Secondary | ICD-10-CM | POA: Diagnosis not present

## 2022-01-16 DIAGNOSIS — K838 Other specified diseases of biliary tract: Secondary | ICD-10-CM | POA: Diagnosis not present

## 2022-01-16 DIAGNOSIS — R339 Retention of urine, unspecified: Secondary | ICD-10-CM | POA: Diagnosis not present

## 2022-01-16 DIAGNOSIS — K8689 Other specified diseases of pancreas: Secondary | ICD-10-CM | POA: Diagnosis not present

## 2022-01-16 DIAGNOSIS — G3184 Mild cognitive impairment, so stated: Secondary | ICD-10-CM | POA: Diagnosis not present

## 2022-01-16 DIAGNOSIS — K851 Biliary acute pancreatitis without necrosis or infection: Secondary | ICD-10-CM | POA: Diagnosis not present

## 2022-01-16 DIAGNOSIS — F419 Anxiety disorder, unspecified: Secondary | ICD-10-CM | POA: Diagnosis not present

## 2022-01-16 DIAGNOSIS — R7401 Elevation of levels of liver transaminase levels: Secondary | ICD-10-CM | POA: Diagnosis not present

## 2022-01-16 DIAGNOSIS — R079 Chest pain, unspecified: Secondary | ICD-10-CM | POA: Diagnosis not present

## 2022-01-16 DIAGNOSIS — J90 Pleural effusion, not elsewhere classified: Secondary | ICD-10-CM | POA: Diagnosis not present

## 2022-01-16 DIAGNOSIS — F79 Unspecified intellectual disabilities: Secondary | ICD-10-CM | POA: Diagnosis not present

## 2022-01-16 DIAGNOSIS — R112 Nausea with vomiting, unspecified: Secondary | ICD-10-CM | POA: Diagnosis not present

## 2022-01-16 DIAGNOSIS — Z20822 Contact with and (suspected) exposure to covid-19: Secondary | ICD-10-CM | POA: Diagnosis not present

## 2022-01-16 DIAGNOSIS — R945 Abnormal results of liver function studies: Secondary | ICD-10-CM | POA: Diagnosis not present

## 2022-01-16 DIAGNOSIS — K769 Liver disease, unspecified: Secondary | ICD-10-CM | POA: Diagnosis not present

## 2022-01-21 DIAGNOSIS — K8010 Calculus of gallbladder with chronic cholecystitis without obstruction: Secondary | ICD-10-CM | POA: Diagnosis not present

## 2022-01-21 DIAGNOSIS — K8080 Other cholelithiasis without obstruction: Secondary | ICD-10-CM | POA: Diagnosis not present

## 2022-01-21 DIAGNOSIS — R7989 Other specified abnormal findings of blood chemistry: Secondary | ICD-10-CM | POA: Diagnosis not present

## 2022-01-21 DIAGNOSIS — I959 Hypotension, unspecified: Secondary | ICD-10-CM | POA: Diagnosis not present

## 2022-01-21 DIAGNOSIS — K8000 Calculus of gallbladder with acute cholecystitis without obstruction: Secondary | ICD-10-CM | POA: Diagnosis not present

## 2022-01-21 DIAGNOSIS — T446X5A Adverse effect of alpha-adrenoreceptor antagonists, initial encounter: Secondary | ICD-10-CM | POA: Diagnosis not present

## 2022-01-21 DIAGNOSIS — Z9989 Dependence on other enabling machines and devices: Secondary | ICD-10-CM | POA: Diagnosis not present

## 2022-01-21 DIAGNOSIS — S72001D Fracture of unspecified part of neck of right femur, subsequent encounter for closed fracture with routine healing: Secondary | ICD-10-CM | POA: Diagnosis not present

## 2022-01-21 DIAGNOSIS — K3189 Other diseases of stomach and duodenum: Secondary | ICD-10-CM | POA: Diagnosis not present

## 2022-01-21 DIAGNOSIS — M79606 Pain in leg, unspecified: Secondary | ICD-10-CM | POA: Diagnosis not present

## 2022-01-21 DIAGNOSIS — K8062 Calculus of gallbladder and bile duct with acute cholecystitis without obstruction: Secondary | ICD-10-CM | POA: Diagnosis not present

## 2022-01-21 DIAGNOSIS — Z743 Need for continuous supervision: Secondary | ICD-10-CM | POA: Diagnosis not present

## 2022-01-21 DIAGNOSIS — K8050 Calculus of bile duct without cholangitis or cholecystitis without obstruction: Secondary | ICD-10-CM | POA: Diagnosis not present

## 2022-01-21 DIAGNOSIS — F32A Depression, unspecified: Secondary | ICD-10-CM | POA: Diagnosis not present

## 2022-01-21 DIAGNOSIS — Z9889 Other specified postprocedural states: Secondary | ICD-10-CM | POA: Diagnosis not present

## 2022-01-21 DIAGNOSIS — G3184 Mild cognitive impairment, so stated: Secondary | ICD-10-CM | POA: Diagnosis not present

## 2022-01-21 DIAGNOSIS — F419 Anxiety disorder, unspecified: Secondary | ICD-10-CM | POA: Diagnosis not present

## 2022-01-21 DIAGNOSIS — K851 Biliary acute pancreatitis without necrosis or infection: Secondary | ICD-10-CM | POA: Diagnosis not present

## 2022-01-21 DIAGNOSIS — R339 Retention of urine, unspecified: Secondary | ICD-10-CM | POA: Diagnosis not present

## 2022-01-21 DIAGNOSIS — K859 Acute pancreatitis without necrosis or infection, unspecified: Secondary | ICD-10-CM | POA: Diagnosis not present

## 2022-01-21 DIAGNOSIS — R5381 Other malaise: Secondary | ICD-10-CM | POA: Diagnosis not present

## 2022-01-21 DIAGNOSIS — R748 Abnormal levels of other serum enzymes: Secondary | ICD-10-CM | POA: Diagnosis not present

## 2022-01-21 DIAGNOSIS — R279 Unspecified lack of coordination: Secondary | ICD-10-CM | POA: Diagnosis not present

## 2022-01-21 DIAGNOSIS — K838 Other specified diseases of biliary tract: Secondary | ICD-10-CM | POA: Diagnosis not present

## 2022-01-21 DIAGNOSIS — K8043 Calculus of bile duct with acute cholecystitis with obstruction: Secondary | ICD-10-CM | POA: Diagnosis not present

## 2022-01-27 DIAGNOSIS — M25551 Pain in right hip: Secondary | ICD-10-CM | POA: Diagnosis not present

## 2022-02-14 DIAGNOSIS — F339 Major depressive disorder, recurrent, unspecified: Secondary | ICD-10-CM | POA: Diagnosis not present

## 2022-02-14 DIAGNOSIS — K5900 Constipation, unspecified: Secondary | ICD-10-CM | POA: Diagnosis not present

## 2022-02-14 DIAGNOSIS — M25551 Pain in right hip: Secondary | ICD-10-CM | POA: Diagnosis not present

## 2022-02-14 DIAGNOSIS — M818 Other osteoporosis without current pathological fracture: Secondary | ICD-10-CM | POA: Diagnosis not present

## 2022-03-03 ENCOUNTER — Emergency Department (HOSPITAL_COMMUNITY)
Admission: EM | Admit: 2022-03-03 | Discharge: 2022-03-03 | Disposition: A | Discharge status: Home / Self Care | Payer: Medicare Other | Attending: Emergency Medicine | Admitting: Emergency Medicine

## 2022-03-03 ENCOUNTER — Encounter (HOSPITAL_COMMUNITY): Payer: Self-pay | Admitting: *Deleted

## 2022-03-03 ENCOUNTER — Emergency Department (HOSPITAL_COMMUNITY): Payer: Medicare Other

## 2022-03-03 DIAGNOSIS — Z9889 Other specified postprocedural states: Secondary | ICD-10-CM | POA: Diagnosis not present

## 2022-03-03 DIAGNOSIS — M25551 Pain in right hip: Secondary | ICD-10-CM | POA: Insufficient documentation

## 2022-03-03 DIAGNOSIS — S72141G Displaced intertrochanteric fracture of right femur, subsequent encounter for closed fracture with delayed healing: Secondary | ICD-10-CM | POA: Diagnosis not present

## 2022-03-03 DIAGNOSIS — M71051 Abscess of bursa, right hip: Secondary | ICD-10-CM | POA: Diagnosis not present

## 2022-03-03 DIAGNOSIS — L02415 Cutaneous abscess of right lower limb: Secondary | ICD-10-CM

## 2022-03-03 DIAGNOSIS — S72001G Fracture of unspecified part of neck of right femur, subsequent encounter for closed fracture with delayed healing: Secondary | ICD-10-CM

## 2022-03-03 DIAGNOSIS — S72001A Fracture of unspecified part of neck of right femur, initial encounter for closed fracture: Secondary | ICD-10-CM | POA: Diagnosis not present

## 2022-03-03 LAB — COMPREHENSIVE METABOLIC PANEL
ALT: 13 U/L (ref 0–44)
AST: 16 U/L (ref 15–41)
Albumin: 3.9 g/dL (ref 3.5–5.0)
Alkaline Phosphatase: 114 U/L (ref 38–126)
Anion gap: 9 (ref 5–15)
BUN: 12 mg/dL (ref 8–23)
CO2: 25 mmol/L (ref 22–32)
Calcium: 9.1 mg/dL (ref 8.9–10.3)
Chloride: 102 mmol/L (ref 98–111)
Creatinine, Ser: 0.77 mg/dL (ref 0.61–1.24)
GFR, Estimated: >60 mL/min (ref >60)
Glucose, Bld: 82 mg/dL (ref 70–99)
Potassium: 3.8 mmol/L (ref 3.5–5.1)
Sodium: 136 mmol/L (ref 135–145)
Total Bilirubin: 0.5 mg/dL (ref 0.3–1.2)
Total Protein: 6.8 g/dL (ref 6.5–8.1)

## 2022-03-03 LAB — CBC
HCT: 39.1 % (ref 39.0–52.0)
Hemoglobin: 12.2 g/dL — ABNORMAL LOW (ref 13.0–17.0)
MCH: 28.6 pg (ref 26.0–34.0)
MCHC: 31.2 g/dL (ref 30.0–36.0)
MCV: 91.6 fL (ref 80.0–100.0)
Platelets: 283 10*3/uL (ref 150–400)
RBC: 4.27 MIL/uL (ref 4.22–5.81)
RDW: 14.4 % (ref 11.5–15.5)
WBC: 6 10*3/uL (ref 4.0–10.5)
nRBC: 0 % (ref 0.0–0.2)

## 2022-03-03 LAB — SEDIMENTATION RATE: Sed Rate: 25 mm/hr — ABNORMAL HIGH (ref 0–16)

## 2022-03-03 LAB — C-REACTIVE PROTEIN: CRP: 3.4 mg/dL — ABNORMAL HIGH (ref <1.0)

## 2022-03-03 LAB — LACTIC ACID, PLASMA: Lactic Acid, Venous: 1 mmol/L (ref 0.5–1.9)

## 2022-03-03 MED ORDER — CEFDINIR 300 MG PO CAPS: 300.0000 mg | ORAL_CAPSULE | Freq: Two times a day (BID) | ORAL | 0 refills | Status: AC

## 2022-03-03 MED ORDER — VANCOMYCIN HCL IN DEXTROSE 1-5 GM/200ML-% IV SOLN
1000.0000 mg | Freq: Once | INTRAVENOUS | Status: AC
  Administered 2022-03-03: 1000 mg via INTRAVENOUS
  Filled 2022-03-03: qty 200

## 2022-03-03 MED ORDER — SODIUM CHLORIDE 0.9 % IV SOLN
2.0000 g | Freq: Once | INTRAVENOUS | Status: AC
  Administered 2022-03-03: 2 g via INTRAVENOUS
  Filled 2022-03-03: qty 12.5

## 2022-03-03 MED ORDER — OXYCODONE-ACETAMINOPHEN 5-325 MG PO TABS
1.0000 | ORAL_TABLET | Freq: Once | ORAL | Status: AC
  Administered 2022-03-03: 1 via ORAL
  Filled 2022-03-03: qty 1

## 2022-03-03 NOTE — ED Notes (Signed)
Patient transported to X-ray 

## 2022-03-03 NOTE — ED Provider Notes (Signed)
?[facility] ?Provider Note ? ? ?CSN: [id number] ?Arrival date & time: 03/03/22  1416 ? ?  ? ?History ? ?Chief Complaint  ?Patient presents with  ? Hip Pain  ? ? ?Archit Leger is a 66 y.o. male. ? ?Pt with c/o increasing right hip pain in past week. States originally had hip fx and surgery s/p mechanical fall 08/2021 (Dr Sheliah Plane, [facility]), and subsequent infection x 2, and wash out/wound vac procedures. States off antibiotic therapy in past month, and now, in past week, increased redness/swelling/pain to right hip laterally at site prior incision. Indicates went to PCP today and had I and abscess - notes good amount bloody pus drained and packing placed. Denies fever/chills. No nv.  ? ?The history is provided by the patient, a relative and medical records. The history is limited by the condition of the patient.  ?Hip Pain ?Pertinent negatives include no chest pain, no abdominal pain, no headaches and no shortness of breath.  ? ?  ? ?Home Medications ?Prior to Admission medications   ?Not on File  ?   ? ?Allergies    ?Patient has no known allergies.   ? ?Review of Systems   ?Review of Systems  ?Constitutional:  Negative for chills and fever.  ?HENT:  Negative for sore throat.   ?Eyes:  Negative for redness.  ?Respiratory:  Negative for shortness of breath.   ?Cardiovascular:  Negative for chest pain.  ?Gastrointestinal:  Negative for abdominal pain.  ?Genitourinary:  Negative for flank pain.  ?Musculoskeletal:  Negative for back pain and neck pain.  ?Skin:  Negative for rash.  ?Neurological:  Negative for headaches.  ?Hematological:  Does not bruise/bleed easily.  ?Psychiatric/Behavioral:  Negative for confusion.   ? ?Physical Exam ?Updated Vital Signs ?BP 114/71   Pulse 88   Temp 98.5 ?F (36.9 ?C)   Resp 20   Ht 1.905 m ('6\' 3"'$ )   Wt 76.7 kg   SpO2 98%   BMI 21.12 kg/m?  ?Physical Exam ?Vitals and nursing note reviewed.  ?Constitutional:   ?   Appearance: Normal appearance. He is well-developed.   ?HENT:  ?   Head: Atraumatic.  ?   Nose: Nose normal.  ?   Mouth/Throat:  ?   Mouth: Mucous membranes are moist.  ?Eyes:  ?   General: No scleral icterus. ?   Conjunctiva/sclera: Conjunctivae normal.  ?Neck:  ?   Trachea: No tracheal deviation.  ?Cardiovascular:  ?   Rate and Rhythm: Normal rate and regular rhythm.  ?   Pulses: Normal pulses.  ?   Heart sounds: Normal heart sounds. No murmur heard. ?  No friction rub. No gallop.  ?Pulmonary:  ?   Effort: Pulmonary effort is normal. No accessory muscle usage or respiratory distress.  ?   Breath sounds: Normal breath sounds.  ?Abdominal:  ?   General: Bowel sounds are normal. There is no distension.  ?   Palpations: Abdomen is soft.  ?   Tenderness: There is no abdominal tenderness.  ?Genitourinary: ?   Comments: No cva tenderness. ?Musculoskeletal:     ?   General: No swelling.  ?   Cervical back: Normal range of motion and neck supple. No rigidity.  ?   Comments: 1 cm open I and D wound in area of previous lower hip surgical incision - iodoform gauze packing in wound. No necrotic or devitalized tissue. No crepitus. Area is mildly erythematous, ~ 10 cm diameter area of erythema.  Minimal pain  w passive rom at knee and hip. Distal pulses palp.    ?Skin: ?   General: Skin is warm and dry.  ?   Findings: No rash.  ?Neurological:  ?   Mental Status: He is alert.  ?   Comments: Alert, speech clear. RLE nvi.   ?Psychiatric:     ?   Mood and Affect: Mood normal.  ? ? ?ED Results / Procedures / Treatments   ?Labs ?(all labs ordered are listed, but only abnormal results are displayed) ?Results for orders placed or performed during the hospital encounter of 03/03/22  ?CBC  ?Result Value Ref Range  ? WBC 6.0 4.0 - 10.5 K/uL  ? RBC 4.27 4.22 - 5.81 MIL/uL  ? Hemoglobin 12.2 (L) 13.0 - 17.0 g/dL  ? HCT 39.1 39.0 - 52.0 %  ? MCV 91.6 80.0 - 100.0 fL  ? MCH 28.6 26.0 - 34.0 pg  ? MCHC 31.2 30.0 - 36.0 g/dL  ? RDW 14.4 11.5 - 15.5 %  ? Platelets 283 150 - 400 K/uL  ? nRBC 0.0 0.0 -  0.2 %  ?Comprehensive metabolic panel  ?Result Value Ref Range  ? Sodium 136 135 - 145 mmol/L  ? Potassium 3.8 3.5 - 5.1 mmol/L  ? Chloride 102 98 - 111 mmol/L  ? CO2 25 22 - 32 mmol/L  ? Glucose, Bld 82 70 - 99 mg/dL  ? BUN 12 8 - 23 mg/dL  ? Creatinine, Ser 0.77 0.61 - 1.24 mg/dL  ? Calcium 9.1 8.9 - 10.3 mg/dL  ? Total Protein 6.8 6.5 - 8.1 g/dL  ? Albumin 3.9 3.5 - 5.0 g/dL  ? AST 16 15 - 41 U/L  ? ALT 13 0 - 44 U/L  ? Alkaline Phosphatase 114 38 - 126 U/L  ? Total Bilirubin 0.5 0.3 - 1.2 mg/dL  ? GFR, Estimated >60 >60 mL/min  ? Anion gap 9 5 - 15  ?Lactic acid, plasma  ?Result Value Ref Range  ? Lactic Acid, Venous 1.0 0.5 - 1.9 mmol/L  ? ? ? ? ?EKG ?None ? ?Radiology ?DG HIP UNILAT W OR W/O PELVIS 2-3 VIEWS RIGHT ? ?Result Date: 03/03/2022 ?CLINICAL DATA:  66 year old male with pain and drainage from right hip wound. EXAM: DG HIP (WITH OR WITHOUT PELVIS) 2-3V RIGHT COMPARISON:  11/10/2021 FINDINGS: Partially visualized postsurgical changes after right femoral cephalomedullary nail of comminuted intertrochanteric/proximal femoral fracture. No significant interval callus formation. Mild heterotopic ossification just lateral and inferior to the greater trochanter. No evidence of hardware loosening, fracture, or malalignment. No evidence of new fracture or malalignment. Diffuse osteopenia. 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 No evidence of hardware complication. Electronically Signed   By: Ruthann Cancer M.D.   On: 03/03/2022 16:37   ? ?Procedures ?Procedures  ? ? ?Medications Ordered in ED ?Medications - No data to display ? ?ED Course/ Medical Decision Making/ A&P ?  ?                        ?Medical Decision Making ?Problems Addressed: ?Abscess of right hip: acute illness or injury ?   Details: recurrent ?Closed right hip fracture, with delayed healing, subsequent encounter: chronic illness or injury with exacerbation, progression, or side  effects of treatment ? ?Amount and/or Complexity of Data Reviewed ?Independent Historian:  ?   Details: family, additional hx ?External Data Reviewed: radiology and notes. ?Labs: ordered. Decision-making details documented in ED Course. ?Radiology:  ordered and independent interpretation performed. Decision-making details documented in ED Course. ?Discussion of management or test interpretation with external provider(s): Orthopedist, Dr Sheliah Plane - discussed pt.  ? ?Risk ?OTC drugs. ?Prescription drug management. ?Decision regarding hospitalization. ? ? ?Iv ns. Continuous pulse ox and cardiac monitoring. Labs ordered/sent. Imaging ordered. Dispo decision including potential admission considered - will get labs and discuss w ortho and revisit decision.  ? ?Looked at wound, I and D from pcp office, abscess cavity seems small and superficial, ~ 1 cm depth. No additional purulent material able to expressed. No fluctuance. No crepitus. No necrotic or devitalized tissue.  ? ?Family member/pt request dose iv abx - vanc and cefepime iv.  ? ?Reviewed nursing notes and prior charts for additional history. External reports reviewed. Additional history from: ? ?Cardiac monitor: sinus rhythm, rate 89.  ? ?Labs reviewed/interpreted by me - wbc normal. Lactate normal.  ? ?Xrays reviewed/interpreted by me - chronic changes/fxs/collapse.  ? ?Pt's orthopedist called/consulted. Discussed pt with Dr Judithann Sauger - he indicates pt well known to him, saw last month, indicates feels best plan would be to re-start his oral abx, cefdinir, d/c home, and he will see in office tomorrow. He will decide on whether to refer to [facility] main, or [place] or other facility, at that time.  ? ?Pt currently appears stable for d/c.  ? ?Return precautions provided.  ? ? ? ? ? ? ? ? ? ? ? ? ?Final Clinical Impression(s) / ED Diagnoses ?Final diagnoses:  ?None  ? ? ?Rx / DC Orders ?ED Discharge Orders   ? ? None  ? ?  ? ? ?  ?Lajean Saver, MD ?03/03/22 1714 ? ?

## 2022-03-03 NOTE — ED Triage Notes (Signed)
Right hip pain, states he had drain placed in right hip today ?

## 2022-03-03 NOTE — Discharge Instructions (Addendum)
It was our pleasure to provide your ER care today - we hope that you feel better. ? ?Take cefdinir (antibiotic) as prescribed.  ? ?We discussed your case with Dr Sheliah Plane - he indicates for you to follow up with him in the office tomorrow - go to office at 9 AM to be seen - he will recheck you and discuss plan of care then.   Have wound/packing rechecked, and have packing removed in the next couple days.  ? ?Return to ER if worse, new symptoms, severe/intractable pain, high fevers, spreading redness, or other concern.  ?

## 2022-03-10 DIAGNOSIS — M869 Osteomyelitis, unspecified: Secondary | ICD-10-CM | POA: Diagnosis not present

## 2022-03-10 DIAGNOSIS — Z8739 Personal history of other diseases of the musculoskeletal system and connective tissue: Secondary | ICD-10-CM | POA: Diagnosis not present

## 2022-03-10 DIAGNOSIS — T8141XD Infection following a procedure, superficial incisional surgical site, subsequent encounter: Secondary | ICD-10-CM | POA: Diagnosis not present

## 2022-03-10 DIAGNOSIS — S72141G Displaced intertrochanteric fracture of right femur, subsequent encounter for closed fracture with delayed healing: Secondary | ICD-10-CM | POA: Diagnosis not present

## 2022-03-10 DIAGNOSIS — Z872 Personal history of diseases of the skin and subcutaneous tissue: Secondary | ICD-10-CM | POA: Diagnosis not present

## 2022-03-21 DIAGNOSIS — T8141XD Infection following a procedure, superficial incisional surgical site, subsequent encounter: Secondary | ICD-10-CM | POA: Diagnosis not present

## 2022-04-18 DIAGNOSIS — M2559 Pain in other specified joint: Secondary | ICD-10-CM | POA: Diagnosis not present

## 2022-04-18 DIAGNOSIS — S72141G Displaced intertrochanteric fracture of right femur, subsequent encounter for closed fracture with delayed healing: Secondary | ICD-10-CM | POA: Diagnosis not present

## 2022-04-18 DIAGNOSIS — M25551 Pain in right hip: Secondary | ICD-10-CM | POA: Diagnosis not present

## 2022-04-18 DIAGNOSIS — T8141XD Infection following a procedure, superficial incisional surgical site, subsequent encounter: Secondary | ICD-10-CM | POA: Diagnosis not present

## 2022-04-20 DIAGNOSIS — Z20822 Contact with and (suspected) exposure to covid-19: Secondary | ICD-10-CM | POA: Diagnosis not present

## 2022-04-20 DIAGNOSIS — M7989 Other specified soft tissue disorders: Secondary | ICD-10-CM | POA: Diagnosis not present

## 2022-04-20 DIAGNOSIS — L03115 Cellulitis of right lower limb: Secondary | ICD-10-CM | POA: Diagnosis not present

## 2022-04-20 DIAGNOSIS — Z79899 Other long term (current) drug therapy: Secondary | ICD-10-CM | POA: Diagnosis not present

## 2022-04-20 DIAGNOSIS — Z79891 Long term (current) use of opiate analgesic: Secondary | ICD-10-CM | POA: Diagnosis not present

## 2022-04-20 DIAGNOSIS — I959 Hypotension, unspecified: Secondary | ICD-10-CM | POA: Diagnosis not present

## 2022-04-20 DIAGNOSIS — R0902 Hypoxemia: Secondary | ICD-10-CM | POA: Diagnosis not present

## 2022-04-20 DIAGNOSIS — F32A Depression, unspecified: Secondary | ICD-10-CM | POA: Diagnosis not present

## 2022-04-20 DIAGNOSIS — L929 Granulomatous disorder of the skin and subcutaneous tissue, unspecified: Secondary | ICD-10-CM | POA: Diagnosis not present

## 2022-04-20 DIAGNOSIS — Z872 Personal history of diseases of the skin and subcutaneous tissue: Secondary | ICD-10-CM | POA: Diagnosis not present

## 2022-04-20 DIAGNOSIS — Z792 Long term (current) use of antibiotics: Secondary | ICD-10-CM | POA: Diagnosis not present

## 2022-04-20 DIAGNOSIS — I952 Hypotension due to drugs: Secondary | ICD-10-CM | POA: Diagnosis not present

## 2022-04-20 DIAGNOSIS — R600 Localized edema: Secondary | ICD-10-CM | POA: Diagnosis not present

## 2022-04-20 DIAGNOSIS — I700 Atherosclerosis of aorta: Secondary | ICD-10-CM | POA: Diagnosis not present

## 2022-04-20 DIAGNOSIS — E7800 Pure hypercholesterolemia, unspecified: Secondary | ICD-10-CM | POA: Diagnosis not present

## 2022-04-20 DIAGNOSIS — I9581 Postprocedural hypotension: Secondary | ICD-10-CM | POA: Diagnosis not present

## 2022-04-20 DIAGNOSIS — M00851 Arthritis due to other bacteria, right hip: Secondary | ICD-10-CM | POA: Diagnosis not present

## 2022-04-20 DIAGNOSIS — T8149XA Infection following a procedure, other surgical site, initial encounter: Secondary | ICD-10-CM | POA: Diagnosis not present

## 2022-04-20 DIAGNOSIS — M6281 Muscle weakness (generalized): Secondary | ICD-10-CM | POA: Diagnosis not present

## 2022-04-20 DIAGNOSIS — M25551 Pain in right hip: Secondary | ICD-10-CM | POA: Diagnosis not present

## 2022-04-20 DIAGNOSIS — A499 Bacterial infection, unspecified: Secondary | ICD-10-CM | POA: Diagnosis not present

## 2022-04-20 DIAGNOSIS — M25451 Effusion, right hip: Secondary | ICD-10-CM | POA: Diagnosis not present

## 2022-04-20 DIAGNOSIS — Z9889 Other specified postprocedural states: Secondary | ICD-10-CM | POA: Diagnosis not present

## 2022-04-20 DIAGNOSIS — L02415 Cutaneous abscess of right lower limb: Secondary | ICD-10-CM | POA: Diagnosis not present

## 2022-04-20 DIAGNOSIS — T8789 Other complications of amputation stump: Secondary | ICD-10-CM | POA: Diagnosis not present

## 2022-04-20 DIAGNOSIS — F819 Developmental disorder of scholastic skills, unspecified: Secondary | ICD-10-CM | POA: Diagnosis not present

## 2022-04-20 DIAGNOSIS — L089 Local infection of the skin and subcutaneous tissue, unspecified: Secondary | ICD-10-CM | POA: Diagnosis not present

## 2022-04-20 DIAGNOSIS — R4189 Other symptoms and signs involving cognitive functions and awareness: Secondary | ICD-10-CM | POA: Diagnosis not present

## 2022-04-20 DIAGNOSIS — R41841 Cognitive communication deficit: Secondary | ICD-10-CM | POA: Diagnosis not present

## 2022-04-20 DIAGNOSIS — F419 Anxiety disorder, unspecified: Secondary | ICD-10-CM | POA: Diagnosis not present

## 2022-04-20 DIAGNOSIS — T4145XA Adverse effect of unspecified anesthetic, initial encounter: Secondary | ICD-10-CM | POA: Diagnosis not present

## 2022-04-20 DIAGNOSIS — M71051 Abscess of bursa, right hip: Secondary | ICD-10-CM | POA: Diagnosis not present

## 2022-04-20 DIAGNOSIS — R2689 Other abnormalities of gait and mobility: Secondary | ICD-10-CM | POA: Diagnosis not present

## 2022-04-20 DIAGNOSIS — T84620D Infection and inflammatory reaction due to internal fixation device of right femur, subsequent encounter: Secondary | ICD-10-CM | POA: Diagnosis not present

## 2022-04-20 DIAGNOSIS — Z609 Problem related to social environment, unspecified: Secondary | ICD-10-CM | POA: Diagnosis not present

## 2022-04-20 DIAGNOSIS — Z959 Presence of cardiac and vascular implant and graft, unspecified: Secondary | ICD-10-CM | POA: Diagnosis not present

## 2022-04-20 DIAGNOSIS — S72301A Unspecified fracture of shaft of right femur, initial encounter for closed fracture: Secondary | ICD-10-CM | POA: Diagnosis not present

## 2022-04-20 DIAGNOSIS — Z87891 Personal history of nicotine dependence: Secondary | ICD-10-CM | POA: Diagnosis not present

## 2022-04-20 DIAGNOSIS — T8140XA Infection following a procedure, unspecified, initial encounter: Secondary | ICD-10-CM | POA: Diagnosis not present

## 2022-04-20 DIAGNOSIS — G8929 Other chronic pain: Secondary | ICD-10-CM | POA: Diagnosis not present

## 2022-04-20 DIAGNOSIS — M799 Soft tissue disorder, unspecified: Secondary | ICD-10-CM | POA: Diagnosis not present

## 2022-04-29 DIAGNOSIS — F32A Depression, unspecified: Secondary | ICD-10-CM | POA: Diagnosis not present

## 2022-04-29 DIAGNOSIS — I10 Essential (primary) hypertension: Secondary | ICD-10-CM | POA: Diagnosis not present

## 2022-04-29 DIAGNOSIS — G8929 Other chronic pain: Secondary | ICD-10-CM | POA: Diagnosis not present

## 2022-04-29 DIAGNOSIS — F819 Developmental disorder of scholastic skills, unspecified: Secondary | ICD-10-CM | POA: Diagnosis not present

## 2022-04-29 DIAGNOSIS — Z792 Long term (current) use of antibiotics: Secondary | ICD-10-CM | POA: Diagnosis not present

## 2022-04-29 DIAGNOSIS — Z959 Presence of cardiac and vascular implant and graft, unspecified: Secondary | ICD-10-CM | POA: Diagnosis not present

## 2022-04-29 DIAGNOSIS — R41841 Cognitive communication deficit: Secondary | ICD-10-CM | POA: Diagnosis not present

## 2022-04-29 DIAGNOSIS — R4189 Other symptoms and signs involving cognitive functions and awareness: Secondary | ICD-10-CM | POA: Diagnosis not present

## 2022-04-29 DIAGNOSIS — R531 Weakness: Secondary | ICD-10-CM | POA: Diagnosis not present

## 2022-04-29 DIAGNOSIS — M25551 Pain in right hip: Secondary | ICD-10-CM | POA: Diagnosis not present

## 2022-04-29 DIAGNOSIS — T84620D Infection and inflammatory reaction due to internal fixation device of right femur, subsequent encounter: Secondary | ICD-10-CM | POA: Diagnosis not present

## 2022-04-29 DIAGNOSIS — R2689 Other abnormalities of gait and mobility: Secondary | ICD-10-CM | POA: Diagnosis not present

## 2022-04-29 DIAGNOSIS — F419 Anxiety disorder, unspecified: Secondary | ICD-10-CM | POA: Diagnosis not present

## 2022-04-29 DIAGNOSIS — M6281 Muscle weakness (generalized): Secondary | ICD-10-CM | POA: Diagnosis not present

## 2022-04-29 DIAGNOSIS — A4902 Methicillin resistant Staphylococcus aureus infection, unspecified site: Secondary | ICD-10-CM | POA: Diagnosis not present

## 2022-05-01 DIAGNOSIS — A4902 Methicillin resistant Staphylococcus aureus infection, unspecified site: Secondary | ICD-10-CM | POA: Diagnosis not present

## 2022-05-01 DIAGNOSIS — R531 Weakness: Secondary | ICD-10-CM | POA: Diagnosis not present

## 2022-05-01 DIAGNOSIS — M25551 Pain in right hip: Secondary | ICD-10-CM | POA: Diagnosis not present

## 2022-05-01 DIAGNOSIS — I10 Essential (primary) hypertension: Secondary | ICD-10-CM | POA: Diagnosis not present

## 2022-06-16 DIAGNOSIS — M25559 Pain in unspecified hip: Secondary | ICD-10-CM | POA: Diagnosis not present

## 2022-06-20 DIAGNOSIS — M25551 Pain in right hip: Secondary | ICD-10-CM | POA: Diagnosis not present

## 2022-07-29 DIAGNOSIS — S72141G Displaced intertrochanteric fracture of right femur, subsequent encounter for closed fracture with delayed healing: Secondary | ICD-10-CM | POA: Diagnosis not present

## 2022-07-29 DIAGNOSIS — M2170 Unequal limb length (acquired), unspecified site: Secondary | ICD-10-CM | POA: Diagnosis not present

## 2022-07-29 DIAGNOSIS — M25551 Pain in right hip: Secondary | ICD-10-CM | POA: Diagnosis not present

## 2022-08-15 DIAGNOSIS — F339 Major depressive disorder, recurrent, unspecified: Secondary | ICD-10-CM | POA: Diagnosis not present

## 2022-08-15 DIAGNOSIS — M818 Other osteoporosis without current pathological fracture: Secondary | ICD-10-CM | POA: Diagnosis not present

## 2022-10-14 DIAGNOSIS — S72141G Displaced intertrochanteric fracture of right femur, subsequent encounter for closed fracture with delayed healing: Secondary | ICD-10-CM | POA: Diagnosis not present

## 2022-10-14 DIAGNOSIS — M2170 Unequal limb length (acquired), unspecified site: Secondary | ICD-10-CM | POA: Diagnosis not present

## 2022-12-05 DIAGNOSIS — F330 Major depressive disorder, recurrent, mild: Secondary | ICD-10-CM | POA: Diagnosis not present

## 2022-12-05 DIAGNOSIS — M818 Other osteoporosis without current pathological fracture: Secondary | ICD-10-CM | POA: Diagnosis not present

## 2022-12-05 DIAGNOSIS — Z125 Encounter for screening for malignant neoplasm of prostate: Secondary | ICD-10-CM | POA: Diagnosis not present

## 2022-12-05 DIAGNOSIS — Z96641 Presence of right artificial hip joint: Secondary | ICD-10-CM | POA: Diagnosis not present

## 2022-12-05 DIAGNOSIS — Z79899 Other long term (current) drug therapy: Secondary | ICD-10-CM | POA: Diagnosis not present

## 2023-01-19 IMAGING — DX DG HIP (WITH OR WITHOUT PELVIS) 2-3V*R*
3 series · 3 of 3 positions shown · non-contrast
Comparison: 11/10/2021

CLINICAL DATA: 65-year-old male with pain and drainage from right
hip wound.

EXAM:
DG HIP (WITH OR WITHOUT PELVIS) 2-3V RIGHT

[pelvis ap]
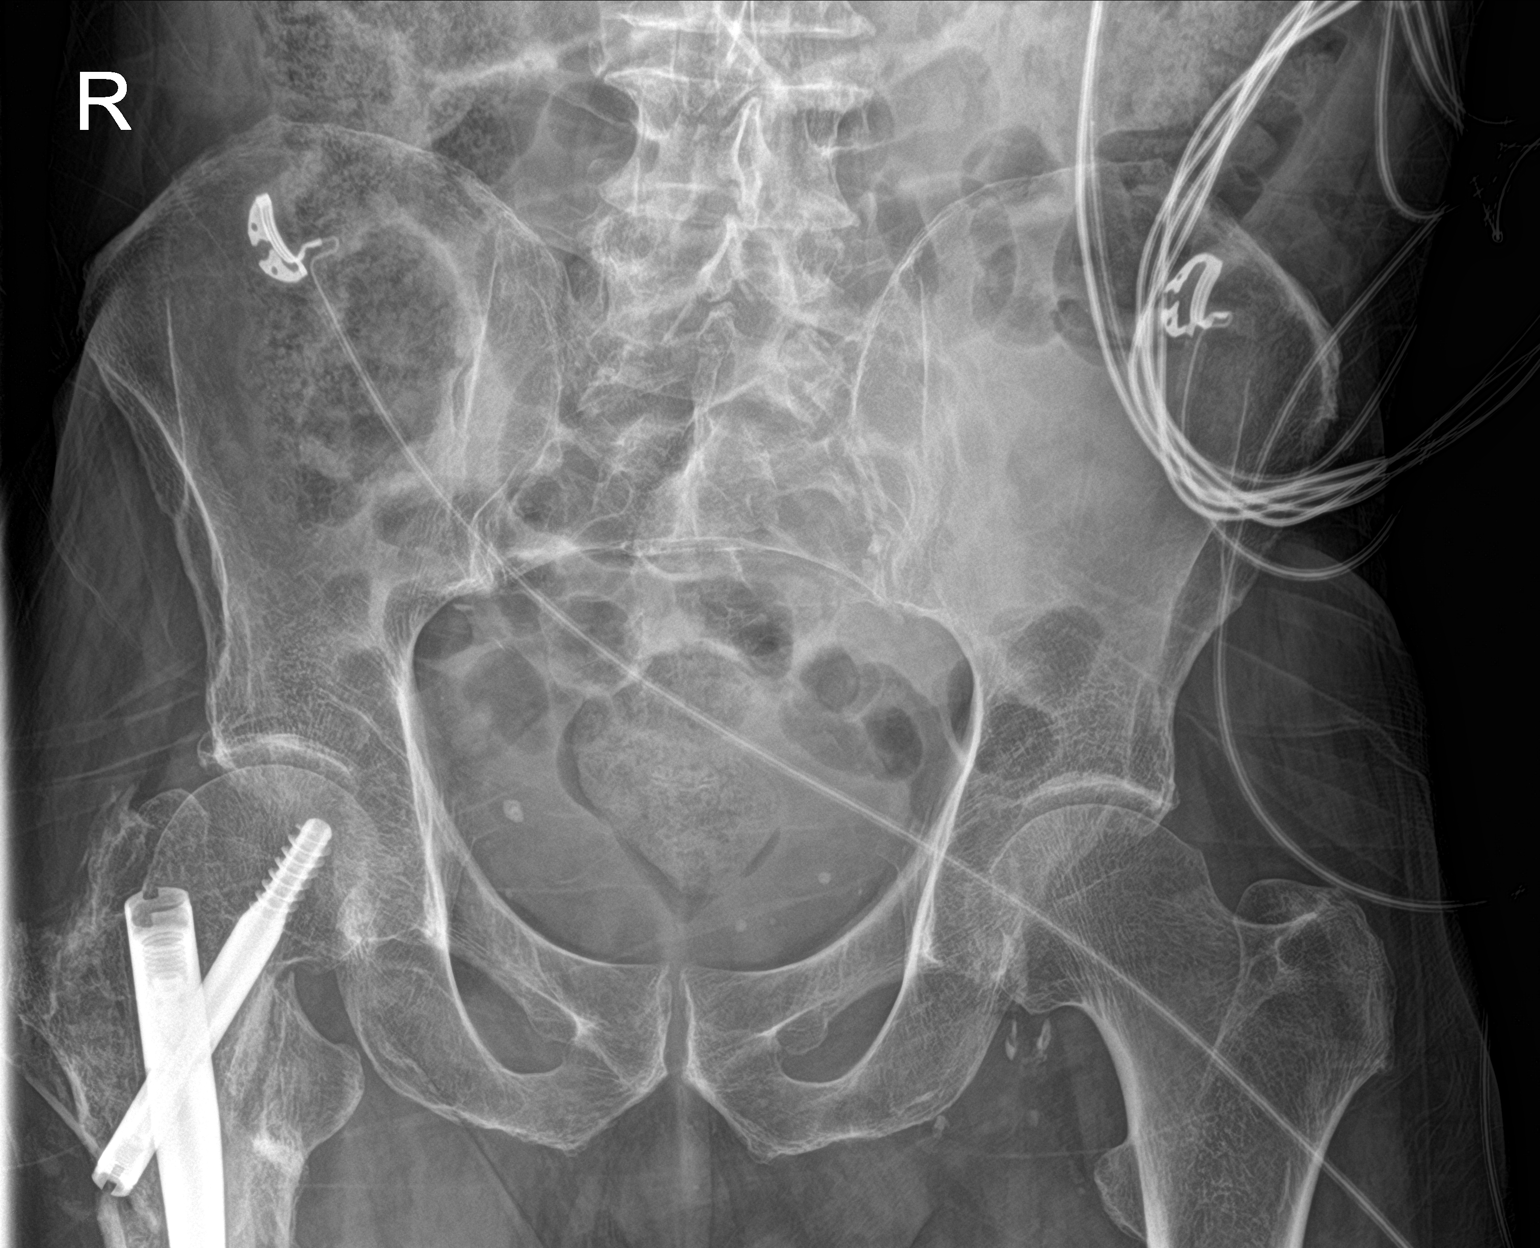

[hip ap]
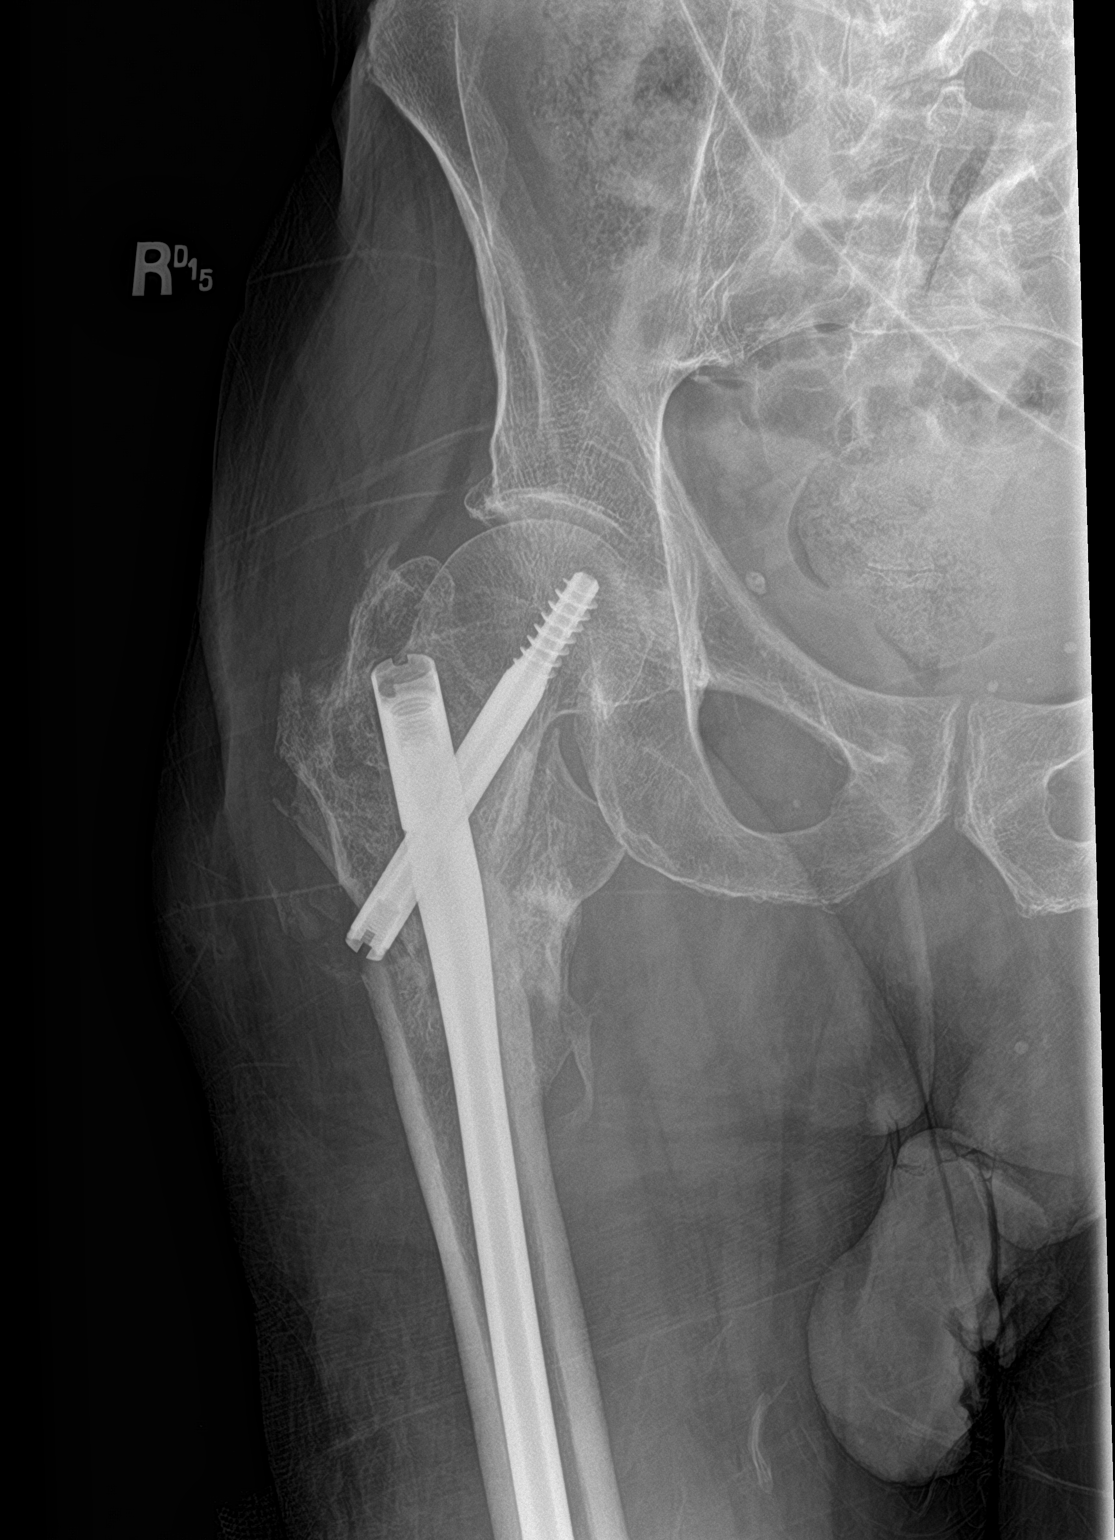

[hip lat]
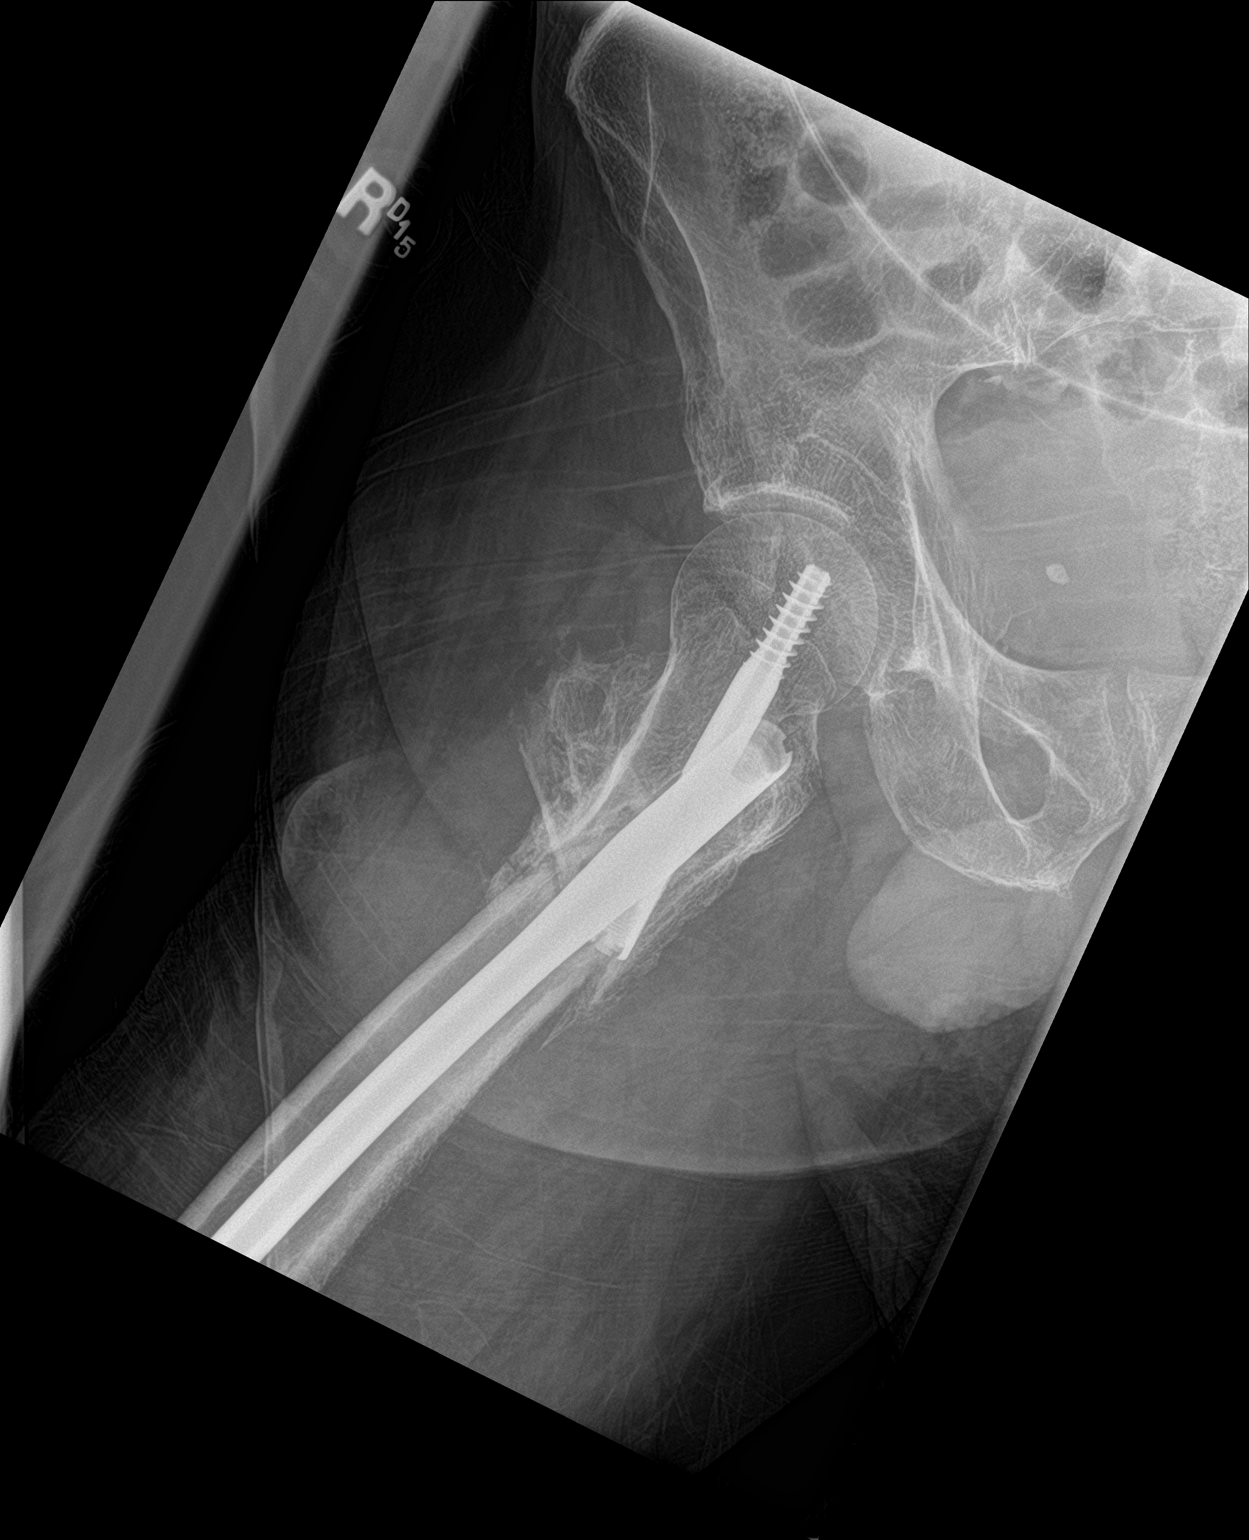

[3 of 3 positions shown; findings below may reference images not displayed]

FINDINGS: Partially visualized postsurgical changes after right femoral
cephalomedullary nail of comminuted intertrochanteric/proximal
femoral fracture. No significant interval callus formation. Mild
heterotopic ossification just lateral and inferior to the greater
trochanter. No evidence of hardware loosening, fracture, or
malalignment. No evidence of new fracture or malalignment. Diffuse
osteopenia.
IMPRESSION: Non-union comminuted proximal right femoral fracture status post
cephalomedullary nail placement. No evidence of hardware
complication.

## 2023-03-06 DIAGNOSIS — F330 Major depressive disorder, recurrent, mild: Secondary | ICD-10-CM | POA: Diagnosis not present

## 2023-03-06 DIAGNOSIS — M818 Other osteoporosis without current pathological fracture: Secondary | ICD-10-CM | POA: Diagnosis not present

## 2023-03-06 DIAGNOSIS — Z96641 Presence of right artificial hip joint: Secondary | ICD-10-CM | POA: Diagnosis not present

## 2023-03-06 DIAGNOSIS — M79604 Pain in right leg: Secondary | ICD-10-CM | POA: Diagnosis not present

## 2023-06-05 DIAGNOSIS — F330 Major depressive disorder, recurrent, mild: Secondary | ICD-10-CM | POA: Diagnosis not present

## 2023-06-05 DIAGNOSIS — M79604 Pain in right leg: Secondary | ICD-10-CM | POA: Diagnosis not present

## 2023-06-05 DIAGNOSIS — M818 Other osteoporosis without current pathological fracture: Secondary | ICD-10-CM | POA: Diagnosis not present

## 2023-06-05 DIAGNOSIS — Z96641 Presence of right artificial hip joint: Secondary | ICD-10-CM | POA: Diagnosis not present

## 2023-09-04 DIAGNOSIS — F330 Major depressive disorder, recurrent, mild: Secondary | ICD-10-CM | POA: Diagnosis not present

## 2023-09-04 DIAGNOSIS — Z96641 Presence of right artificial hip joint: Secondary | ICD-10-CM | POA: Diagnosis not present

## 2023-09-04 DIAGNOSIS — M818 Other osteoporosis without current pathological fracture: Secondary | ICD-10-CM | POA: Diagnosis not present

## 2023-09-04 DIAGNOSIS — M79604 Pain in right leg: Secondary | ICD-10-CM | POA: Diagnosis not present

## 2023-12-11 DIAGNOSIS — M79604 Pain in right leg: Secondary | ICD-10-CM | POA: Diagnosis not present

## 2023-12-11 DIAGNOSIS — M818 Other osteoporosis without current pathological fracture: Secondary | ICD-10-CM | POA: Diagnosis not present

## 2023-12-11 DIAGNOSIS — Z96641 Presence of right artificial hip joint: Secondary | ICD-10-CM | POA: Diagnosis not present

## 2023-12-11 DIAGNOSIS — Z125 Encounter for screening for malignant neoplasm of prostate: Secondary | ICD-10-CM | POA: Diagnosis not present

## 2023-12-11 DIAGNOSIS — E7849 Other hyperlipidemia: Secondary | ICD-10-CM | POA: Diagnosis not present

## 2023-12-11 DIAGNOSIS — F330 Major depressive disorder, recurrent, mild: Secondary | ICD-10-CM | POA: Diagnosis not present

## 2024-02-17 DIAGNOSIS — J9600 Acute respiratory failure, unspecified whether with hypoxia or hypercapnia: Secondary | ICD-10-CM | POA: Diagnosis not present

## 2024-02-17 DIAGNOSIS — G3184 Mild cognitive impairment, so stated: Secondary | ICD-10-CM | POA: Diagnosis not present

## 2024-02-17 DIAGNOSIS — F79 Unspecified intellectual disabilities: Secondary | ICD-10-CM | POA: Diagnosis not present

## 2024-02-17 DIAGNOSIS — R578 Other shock: Secondary | ICD-10-CM | POA: Diagnosis not present

## 2024-02-17 DIAGNOSIS — K55019 Acute (reversible) ischemia of small intestine, extent unspecified: Secondary | ICD-10-CM | POA: Diagnosis not present

## 2024-02-17 DIAGNOSIS — Z9911 Dependence on respirator [ventilator] status: Secondary | ICD-10-CM | POA: Diagnosis not present

## 2024-02-17 DIAGNOSIS — N3001 Acute cystitis with hematuria: Secondary | ICD-10-CM | POA: Diagnosis not present

## 2024-02-17 DIAGNOSIS — N179 Acute kidney failure, unspecified: Secondary | ICD-10-CM | POA: Diagnosis not present

## 2024-02-17 DIAGNOSIS — Z79891 Long term (current) use of opiate analgesic: Secondary | ICD-10-CM | POA: Diagnosis not present

## 2024-02-17 DIAGNOSIS — K9409 Other complications of colostomy: Secondary | ICD-10-CM | POA: Diagnosis not present

## 2024-02-17 DIAGNOSIS — K7689 Other specified diseases of liver: Secondary | ICD-10-CM | POA: Diagnosis not present

## 2024-02-17 DIAGNOSIS — I2510 Atherosclerotic heart disease of native coronary artery without angina pectoris: Secondary | ICD-10-CM | POA: Diagnosis not present

## 2024-02-17 DIAGNOSIS — Z79899 Other long term (current) drug therapy: Secondary | ICD-10-CM | POA: Diagnosis not present

## 2024-02-17 DIAGNOSIS — R140 Abdominal distension (gaseous): Secondary | ICD-10-CM | POA: Diagnosis not present

## 2024-02-17 DIAGNOSIS — Z933 Colostomy status: Secondary | ICD-10-CM | POA: Diagnosis not present

## 2024-02-17 DIAGNOSIS — I252 Old myocardial infarction: Secondary | ICD-10-CM | POA: Diagnosis not present

## 2024-02-17 DIAGNOSIS — E872 Acidosis, unspecified: Secondary | ICD-10-CM | POA: Diagnosis not present

## 2024-02-17 DIAGNOSIS — I469 Cardiac arrest, cause unspecified: Secondary | ICD-10-CM | POA: Diagnosis not present

## 2024-02-17 DIAGNOSIS — I959 Hypotension, unspecified: Secondary | ICD-10-CM | POA: Diagnosis not present

## 2024-02-17 DIAGNOSIS — K55039 Acute (reversible) ischemia of large intestine, extent unspecified: Secondary | ICD-10-CM | POA: Diagnosis not present

## 2024-02-17 DIAGNOSIS — D62 Acute posthemorrhagic anemia: Secondary | ICD-10-CM | POA: Diagnosis not present

## 2024-02-17 DIAGNOSIS — Z7982 Long term (current) use of aspirin: Secondary | ICD-10-CM | POA: Diagnosis not present

## 2024-02-17 DIAGNOSIS — E876 Hypokalemia: Secondary | ICD-10-CM | POA: Diagnosis not present

## 2024-02-17 DIAGNOSIS — E874 Mixed disorder of acid-base balance: Secondary | ICD-10-CM | POA: Diagnosis not present

## 2024-02-17 DIAGNOSIS — Z4682 Encounter for fitting and adjustment of non-vascular catheter: Secondary | ICD-10-CM | POA: Diagnosis not present

## 2024-02-17 DIAGNOSIS — Z87891 Personal history of nicotine dependence: Secondary | ICD-10-CM | POA: Diagnosis not present

## 2024-02-17 DIAGNOSIS — R000 Tachycardia, unspecified: Secondary | ICD-10-CM | POA: Diagnosis not present

## 2024-02-17 DIAGNOSIS — Z515 Encounter for palliative care: Secondary | ICD-10-CM | POA: Diagnosis not present

## 2024-02-17 DIAGNOSIS — K649 Unspecified hemorrhoids: Secondary | ICD-10-CM | POA: Diagnosis not present

## 2024-02-17 DIAGNOSIS — D65 Disseminated intravascular coagulation [defibrination syndrome]: Secondary | ICD-10-CM | POA: Diagnosis not present

## 2024-02-17 DIAGNOSIS — R918 Other nonspecific abnormal finding of lung field: Secondary | ICD-10-CM | POA: Diagnosis not present

## 2024-02-17 DIAGNOSIS — R1084 Generalized abdominal pain: Secondary | ICD-10-CM | POA: Diagnosis not present

## 2024-02-17 DIAGNOSIS — J9602 Acute respiratory failure with hypercapnia: Secondary | ICD-10-CM | POA: Diagnosis not present

## 2024-02-17 DIAGNOSIS — K559 Vascular disorder of intestine, unspecified: Secondary | ICD-10-CM | POA: Diagnosis not present

## 2024-02-17 DIAGNOSIS — F32A Depression, unspecified: Secondary | ICD-10-CM | POA: Diagnosis not present

## 2024-02-17 DIAGNOSIS — D689 Coagulation defect, unspecified: Secondary | ICD-10-CM | POA: Diagnosis not present

## 2024-02-17 DIAGNOSIS — K5939 Other megacolon: Secondary | ICD-10-CM | POA: Diagnosis not present

## 2024-02-17 DIAGNOSIS — F09 Unspecified mental disorder due to known physiological condition: Secondary | ICD-10-CM | POA: Diagnosis not present

## 2024-02-17 DIAGNOSIS — I701 Atherosclerosis of renal artery: Secondary | ICD-10-CM | POA: Diagnosis not present

## 2024-02-17 DIAGNOSIS — F419 Anxiety disorder, unspecified: Secondary | ICD-10-CM | POA: Diagnosis not present

## 2024-02-17 DIAGNOSIS — K562 Volvulus: Secondary | ICD-10-CM | POA: Diagnosis not present
